# Patient Record
Sex: Male | Born: 2000 | Race: Black or African American | Hispanic: No | Marital: Single | Smoking: Current every day smoker
Health system: Southern US, Community
[De-identification: ages and names within clinical notes are randomized; demographics above are authoritative.]

## PROBLEM LIST (undated history)

## (undated) DIAGNOSIS — R569 Unspecified convulsions: Secondary | ICD-10-CM

## (undated) DIAGNOSIS — J45909 Unspecified asthma, uncomplicated: Secondary | ICD-10-CM

## (undated) DIAGNOSIS — S069XAA Unspecified intracranial injury with loss of consciousness status unknown, initial encounter: Secondary | ICD-10-CM

## (undated) DIAGNOSIS — S069X9A Unspecified intracranial injury with loss of consciousness of unspecified duration, initial encounter: Secondary | ICD-10-CM

---
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
## 2021-01-29 ENCOUNTER — Encounter: Payer: Self-pay | Admitting: Emergency Medicine

## 2021-01-29 ENCOUNTER — Other Ambulatory Visit: Payer: Self-pay

## 2021-01-29 ENCOUNTER — Emergency Department
Admission: EM | Admit: 2021-01-29 | Discharge: 2021-01-29 | Disposition: A | Discharge status: Home / Self Care | Payer: No Typology Code available for payment source | Attending: Emergency Medicine | Admitting: Emergency Medicine

## 2021-01-29 DIAGNOSIS — F1210 Cannabis abuse, uncomplicated: Secondary | ICD-10-CM | POA: Diagnosis present

## 2021-01-29 DIAGNOSIS — Z79899 Other long term (current) drug therapy: Secondary | ICD-10-CM | POA: Insufficient documentation

## 2021-01-29 DIAGNOSIS — Z5321 Procedure and treatment not carried out due to patient leaving prior to being seen by health care provider: Secondary | ICD-10-CM | POA: Insufficient documentation

## 2021-01-29 LAB — URINE DRUG SCREEN, QUALITATIVE (ARMC ONLY)
Amphetamines, Ur Screen: NOT DETECTED
Barbiturates, Ur Screen: NOT DETECTED
Benzodiazepine, Ur Scrn: NOT DETECTED
Cannabinoid 50 Ng, Ur ~~LOC~~: POSITIVE — AB
Cocaine Metabolite,Ur ~~LOC~~: NOT DETECTED
MDMA (Ecstasy)Ur Screen: NOT DETECTED
Methadone Scn, Ur: NOT DETECTED
Opiate, Ur Screen: NOT DETECTED
Phencyclidine (PCP) Ur S: NOT DETECTED
Tricyclic, Ur Screen: NOT DETECTED

## 2021-01-29 LAB — SALICYLATE LEVEL: Salicylate Lvl: <7 mg/dL — ABNORMAL LOW (ref 7.0–30.0)

## 2021-01-29 LAB — COMPREHENSIVE METABOLIC PANEL
ALT: 30 U/L (ref 0–44)
AST: 34 U/L (ref 15–41)
Albumin: 5 g/dL (ref 3.5–5.0)
Alkaline Phosphatase: 67 U/L (ref 38–126)
Anion gap: 7 (ref 5–15)
BUN: 11 mg/dL (ref 6–20)
CO2: 24 mmol/L (ref 22–32)
Calcium: 9.7 mg/dL (ref 8.9–10.3)
Chloride: 105 mmol/L (ref 98–111)
Creatinine, Ser: 0.9 mg/dL (ref 0.61–1.24)
GFR, Estimated: >60 mL/min (ref >60)
Glucose, Bld: 90 mg/dL (ref 70–99)
Potassium: 3.8 mmol/L (ref 3.5–5.1)
Sodium: 136 mmol/L (ref 135–145)
Total Bilirubin: 0.6 mg/dL (ref 0.3–1.2)
Total Protein: 8.6 g/dL — ABNORMAL HIGH (ref 6.5–8.1)

## 2021-01-29 LAB — CBC
HCT: 46.8 % (ref 39.0–52.0)
Hemoglobin: 15.5 g/dL (ref 13.0–17.0)
MCH: 26 pg (ref 26.0–34.0)
MCHC: 33.1 g/dL (ref 30.0–36.0)
MCV: 78.5 fL — ABNORMAL LOW (ref 80.0–100.0)
Platelets: 361 10*3/uL (ref 150–400)
RBC: 5.96 MIL/uL — ABNORMAL HIGH (ref 4.22–5.81)
RDW: 14.1 % (ref 11.5–15.5)
WBC: 9 10*3/uL (ref 4.0–10.5)
nRBC: 0 % (ref 0.0–0.2)

## 2021-01-29 LAB — ACETAMINOPHEN LEVEL: Acetaminophen (Tylenol), Serum: <10 ug/mL — ABNORMAL LOW (ref 10–30)

## 2021-01-29 LAB — ETHANOL: Alcohol, Ethyl (B): <10 mg/dL (ref <10)

## 2021-01-29 NOTE — ED Triage Notes (Signed)
Pt comes into the ED via [facility] c/o need for detox ftrom marijuana.  Pt has been smoking for 5 years and wants to seek help.  Pt walked up to the EMS station.  Pt in NAD.   VSS 104HR 99% RA 152/78

## 2021-01-29 NOTE — ED Notes (Signed)
Pt states he no longer wants to be seen by a provider.  He states "I don't think I really need help".  Pt left hospital facility.

## 2021-02-02 ENCOUNTER — Emergency Department
Admission: EM | Admit: 2021-02-02 | Discharge: 2021-02-03 | Disposition: A | Discharge status: Home / Self Care | Payer: Medicaid Other | Attending: Emergency Medicine | Admitting: Emergency Medicine

## 2021-02-02 ENCOUNTER — Encounter: Payer: Self-pay | Admitting: Emergency Medicine

## 2021-02-02 ENCOUNTER — Other Ambulatory Visit: Payer: Self-pay

## 2021-02-02 DIAGNOSIS — Z20822 Contact with and (suspected) exposure to covid-19: Secondary | ICD-10-CM | POA: Diagnosis not present

## 2021-02-02 DIAGNOSIS — F909 Attention-deficit hyperactivity disorder, unspecified type: Secondary | ICD-10-CM | POA: Diagnosis not present

## 2021-02-02 DIAGNOSIS — Z79899 Other long term (current) drug therapy: Secondary | ICD-10-CM | POA: Insufficient documentation

## 2021-02-02 DIAGNOSIS — R4689 Other symptoms and signs involving appearance and behavior: Secondary | ICD-10-CM

## 2021-02-02 DIAGNOSIS — J45909 Unspecified asthma, uncomplicated: Secondary | ICD-10-CM | POA: Diagnosis not present

## 2021-02-02 DIAGNOSIS — S0990XA Unspecified injury of head, initial encounter: Secondary | ICD-10-CM | POA: Diagnosis present

## 2021-02-02 DIAGNOSIS — X58XXXA Exposure to other specified factors, initial encounter: Secondary | ICD-10-CM | POA: Diagnosis not present

## 2021-02-02 DIAGNOSIS — R4189 Other symptoms and signs involving cognitive functions and awareness: Secondary | ICD-10-CM | POA: Diagnosis not present

## 2021-02-02 DIAGNOSIS — F419 Anxiety disorder, unspecified: Secondary | ICD-10-CM | POA: Insufficient documentation

## 2021-02-02 DIAGNOSIS — Z720 Tobacco use: Secondary | ICD-10-CM | POA: Diagnosis present

## 2021-02-02 DIAGNOSIS — F0781 Postconcussional syndrome: Secondary | ICD-10-CM | POA: Insufficient documentation

## 2021-02-02 DIAGNOSIS — Z046 Encounter for general psychiatric examination, requested by authority: Secondary | ICD-10-CM | POA: Diagnosis not present

## 2021-02-02 DIAGNOSIS — F209 Schizophrenia, unspecified: Secondary | ICD-10-CM | POA: Diagnosis not present

## 2021-02-02 DIAGNOSIS — H547 Unspecified visual loss: Secondary | ICD-10-CM | POA: Diagnosis not present

## 2021-02-02 DIAGNOSIS — S069X9A Unspecified intracranial injury with loss of consciousness of unspecified duration, initial encounter: Secondary | ICD-10-CM | POA: Diagnosis present

## 2021-02-02 DIAGNOSIS — H543 Unqualified visual loss, both eyes: Secondary | ICD-10-CM | POA: Diagnosis present

## 2021-02-02 DIAGNOSIS — R419 Unspecified symptoms and signs involving cognitive functions and awareness: Secondary | ICD-10-CM | POA: Insufficient documentation

## 2021-02-02 DIAGNOSIS — F2089 Other schizophrenia: Secondary | ICD-10-CM | POA: Insufficient documentation

## 2021-02-02 DIAGNOSIS — F203 Undifferentiated schizophrenia: Secondary | ICD-10-CM | POA: Diagnosis present

## 2021-02-02 MED ORDER — BUSPIRONE HCL 5 MG PO TABS: 20.0000 mg | ORAL_TABLET | Freq: Every morning | ORAL | Status: DC

## 2021-02-02 MED ORDER — ARIPIPRAZOLE 10 MG PO TABS
20.0000 mg | ORAL_TABLET | Freq: Every day | ORAL | Status: DC
  Filled 2021-02-02: qty 2

## 2021-02-02 MED ORDER — HYDROXYZINE HCL 25 MG PO TABS: 25.0000 mg | ORAL_TABLET | Freq: Every day | ORAL | Status: DC

## 2021-02-02 MED ORDER — BUSPIRONE HCL 5 MG PO TABS: 10.0000 mg | ORAL_TABLET | Freq: Every day | ORAL | Status: DC
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
## 2021-02-02 NOTE — ED Notes (Signed)
Pt is asleep at time will give snack later

## 2021-02-02 NOTE — Consult Note (Signed)
[facility] Face-to-Face Psychiatry Consult   Reason for Consult: Blood in my head Referring Physician: Dr. Derrill Kay Patient Identification: Dennis Dodson MRN:  [id number] Principal Diagnosis: <principal problem not specified> Diagnosis:  Active Problems:   Attention-deficit hyperactivity disorder   Closed head injury   Cognitive and behavioral changes   Post concussion syndrome   Traumatic brain injury with loss of consciousness (HCC)   Visual loss, both eyes unqualified   Anxiety disorder   Other long term (current) drug therapy   Other schizophrenia (HCC)   Nicotine abuse   Total Time spent with patient: 1 hour  Subjective: "I have blood in my head." Dennis Dodson is a 20 y.o. male patient [facility] ED voluntarily. The patient was brought to the ED by law enforcement in handcuffs. It was reported that the patient was cooperative during his assessment. Once the patient was brought into the ED, the police officer left. The patient share that he is here because of internal bleeding in his head. He placed his hands on his head and squeezed the loose skin stating that his head was bleeding. When was he asked why the police brought him in handcuffs? The patient said, "I don't know." The patient stated his head was hurting him a lot but not anymore.   The patient said, "I'm bleeding at the top of my head." He was asked if he saw any blood. The patient denies seeing any blood. The patient appears to have shaven his head. "I cut my hair because it was irritating." The patient currently sees an outpatient psychiatrist with [facility], Dr. Dorene Grebe, and lives at home. The patient is now on an injectible Abilify Maintena 400 mg every 28 days, and the patient missed the month of November. The patient was seen face-to-face by this provider; the chart was reviewed and consulted with Dr. Derrill Kay on 02/02/2021 due to the patient's care. It was discussed with the EDP that the patient  does not meet the criteria to be admitted to the psychiatric inpatient unit.  On evaluation, the patient is alert and oriented x 3, calm, cooperative, and mood-congruent with affect. The patient does not appear to be responding to internal or external stimuli. The patient is presenting with some delusional thinking. The patient denies auditory or visual hallucinations. The patient denies any suicidal, homicidal, or self-harm ideations. The patient is not presenting with any psychotic, but he is presenting some paranoid behaviors.   By Ms. Handy: Collateral information was obtained from patient's mother Dennis Dodson [phone] who reports "he hasn't had his shot for the last month and he's due for it." "He has an appointment with his psychiatrist tomorrow morning at 8am." "Today he just flipped out but he's not a danger to himself or anyone else." Mother reports that she would prefer that patient be observed overnight and will take patient to his psychiatrist in the morning for his injection.  HPI: Dr. Derrill Kay, Dennis Dodson is a 20 y.o. male who presents to the emergency department today brought by police. The patient has history of psychiatric illness. Primary complaint is for bleeding in his head. When asked why he thinks he is bleeding, he squeezes the skin of his head to demonstrate the bleeding. The patient denies any other complaints.    Past Psychiatric History:  Schizophrenia.   Risk to Self:   Risk to Others:   Prior Inpatient Therapy:   Prior Outpatient Therapy:    Past Medical History:  Past Medical History:  Diagnosis Date  Altered mental status 04/02/2019   Asthma    Hypersomnolence 08/29/2018   Mood disorder (HCC) 07/24/2019   Mood disturbance 08/29/2018   Other schizophrenia (HCC) 03/02/2020   Seizures (HCC)    TBI (traumatic brain injury)    No past surgical history on file. Family History: No family history on file. Family Psychiatric  History:  Social History:  Social  History   Substance and Sexual Activity  Alcohol Use No     Social History   Substance and Sexual Activity  Drug Use Not Currently   Types: Marijuana    Social History   Socioeconomic History   Marital status: Single    Spouse name: Not on file   Number of children: 0   Years of education: Not on file   Highest education level: 10th grade  Occupational History   Not on file  Tobacco Use   Smoking status: Never   Smokeless tobacco: Never  Vaping Use   Vaping Use: Never used  Substance and Sexual Activity   Alcohol use: No   Drug use: Not Currently    Types: Marijuana   Sexual activity: Never  Other Topics Concern   Not on file  Social History Narrative   Not on file   Social Determinants of Health   Financial Resource Strain: Not on file  Food Insecurity: Not on file  Transportation Needs: Not on file  Physical Activity: Not on file  Stress: Not on file  Social Connections: Not on file   Additional Social History:    Allergies:  No Known Allergies  Labs: No results found for this or any previous visit (from the past 48 hour(s)).  Current Facility-Administered Medications  Medication Dose Route Frequency Provider Last Rate Last Admin   ARIPiprazole (ABILIFY) tablet 20 mg  20 mg Oral QHS Gillermo Murdoch, NP       busPIRone (BUSPAR) tablet 10 mg  10 mg Oral QHS Gillermo Murdoch, NP       Melene Muller ON 02/03/2021] busPIRone (BUSPAR) tablet 20 mg  20 mg Oral q morning Gillermo Murdoch, NP       hydrOXYzine (ATARAX) tablet 25 mg  25 mg Oral QHS Gillermo Murdoch, NP       Current Outpatient Medications  Medication Sig Dispense Refill   albuterol (PROVENTIL HFA;VENTOLIN HFA) 108 (90 Base) MCG/ACT inhaler Inhale 2 puffs into the lungs every 6 (six) hours as needed for wheezing or shortness of breath. 1 Inhaler 2   ARIPiprazole (ABILIFY) 20 MG tablet TAKE 1 TABLET(20 MG) BY MOUTH DAILY 30 tablet 0   ARIPiprazole ER (ABILIFY MAINTENA) 400 MG SRER  injection Inject 2 mLs (400 mg total) into the muscle every 28 (twenty-eight) days. 1 each 1   busPIRone (BUSPAR) 10 MG tablet TAKE 1 TABLET(10 MG) BY MOUTH TWICE DAILY 60 tablet 1    Musculoskeletal: Strength & Muscle Tone: within normal limits Gait & Station: normal Patient leans: N/A  Psychiatric Specialty Exam:  Presentation  General Appearance: Bizarre  Eye Contact:Fair  Speech:Slow  Speech Volume:Decreased  Handedness:Right   Mood and Affect  Mood:Euthymic  Affect:Inappropriate; Congruent   Thought Process  Thought Processes:Coherent  Descriptions of Associations:Loose  Orientation:Full (Time, Place and Person)  Thought Content:Illogical; Obsessions; Scattered  History of Schizophrenia/Schizoaffective disorder:Yes  Duration of Psychotic Symptoms:Greater than six months  Hallucinations:Hallucinations: None  Ideas of Reference:None  Suicidal Thoughts:Suicidal Thoughts: No  Homicidal Thoughts:Homicidal Thoughts: No   Sensorium  Memory:Immediate Poor; Recent Poor; Remote Poor  Judgment:Fair  Insight:Poor  Executive Functions  Concentration:Fair  Attention Span:Fair  Recall:Poor  [employer] of Knowledge:Poor  Language:Poor   Psychomotor Activity  Psychomotor Activity:Psychomotor Activity: Normal   Assets  Assets:Communication Skills; Desire for Improvement; Physical Health; Resilience; Social Support   Sleep  Sleep:Sleep: Good Number of Hours of Sleep: 8   Physical Exam: Physical Exam Vitals and nursing note reviewed.  Constitutional:      Appearance: Normal appearance.  HENT:     Head: Normocephalic and atraumatic.     Right Ear: External ear normal.     Left Ear: External ear normal.     Nose: Nose normal.     Mouth/Throat:     Mouth: Mucous membranes are moist.  Cardiovascular:     Rate and Rhythm: Normal rate.     Pulses: Normal pulses.  Pulmonary:     Effort: Pulmonary effort is normal.  Musculoskeletal:         General: Normal range of motion.     Cervical back: Normal range of motion and neck supple.  Neurological:     Mental Status: He is alert and oriented to person, place, and time.  Psychiatric:        Attention and Perception: Attention and perception normal.        Mood and Affect: Affect is blunt and flat.        Speech: Speech is delayed.        Behavior: Behavior is slowed. Behavior is cooperative.        Thought Content: Thought content is delusional.        Cognition and Memory: Cognition is impaired.        Judgment: Judgment is impulsive and inappropriate.   Review of Systems  Psychiatric/Behavioral:         No UDS on file  All other systems reviewed and are negative. Blood pressure 128/71, pulse 69, temperature 98.2 F (36.8 C), temperature source Oral, resp. rate 17, height 5\' 9"  (1.753 m), weight 79.4 kg, SpO2 97 %. Body mass index is 25.84 kg/m.  Treatment Plan Summary: Medication management and Plan Patient will be discharge in the morning for his 0800 psych appointment.  Disposition: No evidence of imminent risk to self or others at present.   Patient does not meet criteria for psychiatric inpatient admission. Supportive therapy provided about ongoing stressors. Discussed crisis plan, support from social network, calling 911, coming to the Emergency Department, and calling Suicide Hotline.  , NP 02/02/2021 11:41 PM

## 2021-02-02 NOTE — ED Provider Notes (Signed)
[facility] Emergency Department Provider Note    ____________________________________________   I have reviewed the triage vital signs and the nursing notes.   HISTORY  Chief Complaint Blood in my head   History limited by:  Poor historian   HPI Dennis Dodson is a 20 y.o. male who presents to the emergency department today brought by police. The patient has history of psychiatric illness. Primary complaint is for bleeding in his head. When asked why he thinks he is bleeding, he squeezes the skin of his head to demonstrate the bleeding. The patient denies any other complaints.   Records reviewed. Per medical record review patient has a history of schizophrenia.   Past Medical History:  Diagnosis Date   Altered mental status 04/02/2019   Asthma    Hypersomnolence 08/29/2018   Mood disorder (HCC) 07/24/2019   Mood disturbance 08/29/2018   Other schizophrenia (HCC) 03/02/2020   Seizures (HCC)    TBI (traumatic brain injury)     Patient Active Problem List   Diagnosis Date Noted   Nicotine abuse 12/03/2020   Other schizophrenia (HCC) 03/02/2020   Other long term (current) drug therapy 07/24/2019   Anxiety disorder 06/19/2019   BMI (body mass index), pediatric, 85% to less than 95% for age 10/09/2019   Closed head injury 08/29/2018   Cognitive and behavioral changes 08/29/2018   Post concussion syndrome 08/29/2018   Traumatic brain injury with loss of consciousness (HCC) 08/29/2018   Visual loss, both eyes unqualified 01/31/2014   Attention-deficit hyperactivity disorder 12/11/2013   Other seasonal allergic rhinitis 09/27/2013    No past surgical history on file.  Prior to Admission medications   Medication Sig Start Date End Date Taking? Authorizing Provider  albuterol (PROVENTIL HFA;VENTOLIN HFA) 108 (90 Base) MCG/ACT inhaler Inhale 2 puffs into the lungs every 6 (six) hours as needed for wheezing or shortness of breath. 07/13/15   Triplett, Cari B,  FNP  ARIPiprazole (ABILIFY) 20 MG tablet TAKE 1 TABLET(20 MG) BY MOUTH DAILY 12/03/20   Darcel Smalling, MD  ARIPiprazole ER (ABILIFY MAINTENA) 400 MG SRER injection Inject 2 mLs (400 mg total) into the muscle every 28 (twenty-eight) days. 12/03/20   Clapacs, Jackquline Denmark, MD  busPIRone (BUSPAR) 10 MG tablet TAKE 1 TABLET(10 MG) BY MOUTH TWICE DAILY 12/03/20   Darcel Smalling, MD    Allergies Patient has no known allergies.  No family history on file.  Social History Social History   Tobacco Use   Smoking status: Never   Smokeless tobacco: Never  Vaping Use   Vaping Use: Never used  Substance Use Topics   Alcohol use: No   Drug use: Not Currently    Types: Marijuana    Review of Systems Constitutional: No fever/chills Eyes: No visual changes. ENT: No sore throat. Cardiovascular: Denies chest pain. Respiratory: Denies shortness of breath. Gastrointestinal: No abdominal pain.  No nausea, no vomiting.  No diarrhea.   Genitourinary: Negative for dysuria. Musculoskeletal: Negative for back pain. Skin: Negative for rash. Neurological: Negative for headaches, focal weakness or numbness.  ____________________________________________   PHYSICAL EXAM:  VITAL SIGNS: ED Triage Vitals  Enc Vitals Group     BP 02/02/21 1753 (!) 133/92     Pulse Rate 02/02/21 1753 92     Resp 02/02/21 1753 16     Temp 02/02/21 1753 98.2 F (36.8 C)     Temp Source 02/02/21 1753 Oral     SpO2 02/02/21 1753 96 %  Weight 02/02/21 1754 175 lb (79.4 kg)     Height 02/02/21 1754 5\' 9"  (1.753 m)     Head Circumference --      Peak Flow --      Pain Score 02/02/21 1753 0   Constitutional: Awake and alert. Eyes: Conjunctivae are normal.  ENT      Head: Normocephalic and atraumatic.      Nose: No congestion/rhinnorhea.      Mouth/Throat: Mucous membranes are moist.      Neck: No stridor. Hematological/Lymphatic/Immunilogical: No cervical lymphadenopathy. Cardiovascular: Normal rate, regular  rhythm.  No murmurs, rubs, or gallops.  Respiratory: Normal respiratory effort without tachypnea nor retractions. Breath sounds are clear and equal bilaterally. No wheezes/rales/rhonchi. Gastrointestinal: Soft and non tender. No rebound. No guarding.  Genitourinary: Deferred Musculoskeletal: Normal range of motion in all extremities. No lower extremity edema. Neurologic:  Normal speech and language. No gross focal neurologic deficits are appreciated.  Skin:  Skin is warm, dry and intact. No rash noted. Psychiatric: Abnormal behavior and thought.  ____________________________________________    LABS (pertinent positives/negatives)  None  ____________________________________________   EKG  None  ____________________________________________    RADIOLOGY  None  ____________________________________________   PROCEDURES  Procedures  ____________________________________________   INITIAL IMPRESSION / ASSESSMENT AND PLAN / ED COURSE  Pertinent labs & imaging results that were available during my care of the patient were reviewed by me and considered in my medical decision making (see chart for details).   Patient presents to the emergency department with primary concern for blood in his head.  Patient does exhibit some odd behavior here in the emergency department.  At this time I think likely patient suffering from psychiatric illness.  I have low suspicion for actual intracranial hemorrhage considering lack of headache.  Initially patient is more concerned about blood under his scalp as he demonstrates.  Will have psychiatry evaluate.  The patient has been placed in psychiatric observation due to the need to provide a safe environment for the patient while obtaining psychiatric consultation and evaluation, as well as ongoing medical and medication management to treat the patient's condition.  The patient has not been placed under full IVC at this  time.   ____________________________________________   FINAL CLINICAL IMPRESSION(S) / ED DIAGNOSES  Final diagnoses:  Abnormal behavior     Note: This dictation was prepared with Dragon dictation. Any transcriptional errors that result from this process are unintentional     02/04/21, MD 02/02/21 1950

## 2021-02-02 NOTE — BH Assessment (Signed)
Comprehensive Clinical Assessment (CCA) Note  02/02/2021 Chayson Grall [id number]  Chief Complaint: Patient is a 20 year old male presenting to [facility] ED voluntarily. Per triage note Pt brought in by police, but is cooperative, police is no longer with pt. He reports that he is here because of internal bleeding. He placed his hands on his head and squeezed the loose skin stating that his head is bleeding. Denies having any thoughts of harming himself or others. When ask why the police brought him in handcuffs he reports that he has no idea why they brought him in. Denies any pain at this time. Once Provider came in room to do MSE, pt was slow to respond to questions, states that he smokes Black and Milds, cigarettes and marijuana. During assessment patient appears alert and oriented x3, calm and cooperative. Patient reports that "I'm bleeding at the top of my head." Patient appears to have shaven his head "I cut my hair because it was irritating." Patient currently sees a outpatient psychiatrist with [place] [facility] and is currently living with his parents. Patient denies SI/HI/AH/VH and does not appear to be responding to any internal or external stimuli.  Collateral information was obtained from patient's mother Jeannett Senior [phone] who reports "he hasn't had his shot for the last month and he's due for it." "He has an appointment with his psychiatrist tomorrow morning at 8am." "Today he just flipped out but he's not a danger to himself or anyone else." Mother reports that she would prefer that patient be observed overnight and will take patient to his psychiatrist in the morning for his injection.  Per Psyc NP Ysidro Evert, patient to be observed overnight and discharged back to mother for follow-up with outpatient provider Chief Complaint  Patient presents with   Psychiatric Evaluation   Visit Diagnosis: Other Schizophrenia by hx    CCA Screening, Triage and  Referral (STR)  Patient Reported Information How did you hear about [place]? Legal System  Referral name: Jeannett Senior  Referral phone number: [phone]   Whom do you see for routine medical problems? Primary Care  Practice/Facility Name: [facility] [id number]  Practice/Facility Phone Number: No data recorded Name of Contact: No data recorded Contact Number: [phone]  Contact Fax Number: No data recorded Prescriber Name: No data recorded Prescriber Address (if known): No data recorded  What Is the Reason for Your Visit/Call Today? Patient presents voluntarily in police custody  How Long Has This Been Causing You Problems? 1 wk - 1 month  What Do You Feel Would Help You the Most Today? Other (Comment) (Pt unable to articulate needs during the assessment.)   Have You Recently Been in Any Inpatient Treatment (Hospital/Detox/Crisis Center/28-Day Program)? No  Name/Location of Program/Hospital:No data recorded How Long Were You There? No data recorded When Were You Discharged? No data recorded  Have You Ever Received Services From [facility] Before? No  Who Do You See at [facility]? No data recorded  Have You Recently Had Any Thoughts About Hurting Yourself? No  Are You Planning to Commit Suicide/Harm Yourself At This time? No   Have you Recently Had Thoughts About [place]? No  Explanation: No data recorded  Have You Used Any Alcohol or Drugs in the Past 24 Hours? No  How Long Ago Did You Use Drugs or Alcohol? No data recorded What Did You Use and How Much? No data recorded  Do You Currently Have a Therapist/Psychiatrist? Yes  Name  of Therapist/Psychiatrist: [facility] Recently Discharged From Any Office Practice or Programs? No  Explanation of Discharge From Practice/Program: No data recorded    CCA Screening Triage Referral Assessment Type of Contact: Face-to-Face  Is this  Initial or Reassessment? No data recorded Date Telepsych consult ordered in [facility]:  No data recorded Time Telepsych consult ordered in [facility]:  No data recorded  Patient Reported Information Reviewed? Yes  Patient Left Without Being Seen? No data recorded Reason for Not Completing Assessment: No data recorded  Collateral Involvement: Mother Jeannett Senior [phone]   Does Patient Have a Court Appointed Legal Guardian? No data recorded Name and Contact of Legal Guardian: No data recorded If Minor and Not Living with Parent(s), Who has Custody? n/a  Is CPS involved or ever been involved? Never  Is APS involved or ever been involved? Never   Patient Determined To Be At Risk for Harm To Self or Others Based on Review of Patient Reported Information or Presenting Complaint? No  Method: No data recorded Availability of Means: No data recorded Intent: No data recorded Notification Required: No data recorded Additional Information for Danger to Others Potential: No data recorded Additional Comments for Danger to Others Potential: No data recorded Are There Guns or Other Weapons in Your Home? No data recorded Types of Guns/Weapons: No data recorded Are These Weapons Safely Secured?                            No data recorded Who Could Verify You Are Able To Have These Secured: No data recorded Do You Have any Outstanding Charges, Pending Court Dates, Parole/Probation? No data recorded Contacted To Inform of Risk of Harm To Self or Others: No data recorded  Location of Assessment: [facility] ED   Does Patient Present under Involuntary Commitment? No  IVC Papers Initial File Date: No data recorded  [place] of Residence: [place]   Patient Currently Receiving the Following Services: Medication Management   Determination of Need: Emergent (2 hours)   Options For Referral: Inpatient Hospitalization     CCA Biopsychosocial Intake/Chief Complaint:  "I won't shut up" in the context of  familial interactions  Current Symptoms/Problems: Pt has been exhibiting bizarre behavior, experiencing severe mood swings, and responds to internal stimuli.   Patient Reported Schizophrenia/Schizoaffective Diagnosis in Past: Yes   Strengths: Patient has some difficulty communicating his needs but is able to communicate to some extent  Preferences: None noted  Abilities: n/a   Type of Services Patient Feels are Needed: Pt unable to articulate needs   Initial Clinical Notes/Concerns: Pt seemingly responding to internal stimuli during interview however pt denies it.   Mental Health Symptoms Depression:   None   Duration of Depressive symptoms: No data recorded  Mania:   None   Anxiety:    Irritability; Worrying   Psychosis:   Delusions   Duration of Psychotic symptoms:  Greater than six months   Trauma:   None   Obsessions:   Cause anxiety; Recurrent & persistent thoughts/impulses/images; Poor insight   Compulsions:   Absent insight/delusional   Inattention:   None   Hyperactivity/Impulsivity:   None   Oppositional/Defiant Behaviors:   None   Emotional Irregularity:   None   Other Mood/Personality Symptoms:   Mother reports pt has extreme emotional lability and remarkable mood swings    Mental Status Exam Appearance and self-care  Stature:   Average  Weight:   Average weight   Clothing:   Casual   Grooming:   Normal   Cosmetic use:   None   Posture/gait:   Normal   Motor activity:   Not Remarkable   Sensorium  Attention:   Normal   Concentration:   Normal   Orientation:   X5   Recall/memory:   Normal   Affect and Mood  Affect:   Flat   Mood:   Anxious   Relating  Eye contact:   Normal   Facial expression:   Responsive   Attitude toward examiner:   Cooperative   Thought and Language  Speech flow:  Clear and Coherent   Thought content:   Delusions   Preoccupation:   Ruminations   Hallucinations:    None   Organization:  No data recorded  [employer] of Knowledge:   Fair   Intelligence:   Average   Abstraction:   Functional   Judgement:   Fair   Reality Testing:   Adequate   Insight:   Lacking   Decision Making:   Confused   Social Functioning  Social Maturity:   Responsible   Social Judgement:   Normal   Stress  Stressors:   Other (Comment)   Coping Ability:   Normal   Skill Deficits:   None   Supports:   Family; Friends/Service system     Religion: Religion/Spirituality Are You A Religious Person?: No  Leisure/Recreation: Leisure / Recreation Do You Have Hobbies?: No  Exercise/Diet: Exercise/Diet Do You Exercise?: No Have You Gained or Lost A Significant Amount of Weight in the Past Six Months?: No Do You Follow a Special Diet?: No Do You Have Any Trouble Sleeping?: No   CCA Employment/Education Employment/Work Situation: Employment / Work Technical sales engineer: On disability Why is Patient on Disability: Mental Health How Long has Patient Been on Disability: Unknown Patient's Job has Been Impacted by Current Illness: No Has Patient ever Been in the [employer]?: No  Education: Education Is Patient Currently Attending School?: No Did You Have An Individualized Education Program (IIEP): No Did You Have Any Difficulty At School?: No Patient's Education Has Been Impacted by Current Illness: No   CCA Family/Childhood History Family and Relationship History: Family history Marital status: Single Does patient have children?: No  Childhood History:  Childhood History By whom was/is the patient raised?: Both parents Did patient suffer any verbal/emotional/physical/sexual abuse as a child?: No Did patient suffer from severe childhood neglect?: No Has patient ever been sexually abused/assaulted/raped as an adolescent or adult?: No Was the patient ever a victim of a crime or a disaster?: No Witnessed  domestic violence?: No Has patient been affected by domestic violence as an adult?: No  Child/Adolescent Assessment:     CCA Substance Use Alcohol/Drug Use: Alcohol / Drug Use Pain Medications: See MAR Prescriptions: See MAR Over the Counter: See MAR History of alcohol / drug use?: Yes Substance #1 Name of Substance 1: Marijuana                       ASAM's:  Six Dimensions of Multidimensional Assessment  Dimension 1:  Acute Intoxication and/or Withdrawal Potential:      Dimension 2:  Biomedical Conditions and Complications:      Dimension 3:  Emotional, Behavioral, or Cognitive Conditions and Complications:     Dimension 4:  Readiness to Change:     Dimension 5:  Relapse, Continued use, or Continued  Problem Potential:     Dimension 6:  Recovery/Living Environment:     ASAM Severity Score:    ASAM Recommended Level of Treatment:     Substance use Disorder (SUD)    Recommendations for Services/Supports/Treatments:    DSM5 Diagnoses: Patient Active Problem List   Diagnosis Date Noted   Nicotine abuse 12/03/2020   Other schizophrenia (HCC) 03/02/2020   Other long term (current) drug therapy 07/24/2019   Anxiety disorder 06/19/2019   BMI (body mass index), pediatric, 85% to less than 95% for age 06/09/2019   Closed head injury 08/29/2018   Cognitive and behavioral changes 08/29/2018   Post concussion syndrome 08/29/2018   Traumatic brain injury with loss of consciousness (HCC) 08/29/2018   Visual loss, both eyes unqualified 01/31/2014   Attention-deficit hyperactivity disorder 12/11/2013   Other seasonal allergic rhinitis 09/27/2013    Patient Centered Plan: Patient is on the following Treatment Plan(s):  Impulse Control   Referrals to Alternative Service(s): Referred to Alternative Service(s):   Place:   Date:   Time:    Referred to Alternative Service(s):   Place:   Date:   Time:    Referred to Alternative Service(s):   Place:   Date:   Time:     Referred to Alternative Service(s):   Place:   Date:   Time:     Evangelyne Loja A Danaka Llera, LCAS-A

## 2021-02-02 NOTE — ED Notes (Signed)
Mother called would like to speak to someone to inform  what is going on, said he missed a shot last month and needed it     Rodney Booze [phone]

## 2021-02-02 NOTE — ED Notes (Signed)
Pt is awake and refuse snack and drink at this time

## 2021-02-02 NOTE — ED Notes (Signed)
When asked any questions, patient responds, "I don't know".

## 2021-02-03 ENCOUNTER — Telehealth: Payer: Self-pay | Admitting: Child and Adolescent Psychiatry

## 2021-02-03 ENCOUNTER — Ambulatory Visit: Payer: No Typology Code available for payment source | Admitting: Child and Adolescent Psychiatry

## 2021-02-03 ENCOUNTER — Other Ambulatory Visit: Payer: Self-pay

## 2021-02-03 ENCOUNTER — Emergency Department (EMERGENCY_DEPARTMENT_HOSPITAL)
Admission: EM | Admit: 2021-02-03 | Discharge: 2021-02-04 | Disposition: A | Discharge status: Home / Self Care | Payer: Medicaid Other | Source: Home / Self Care | Attending: Emergency Medicine | Admitting: Emergency Medicine

## 2021-02-03 DIAGNOSIS — R451 Restlessness and agitation: Secondary | ICD-10-CM | POA: Insufficient documentation

## 2021-02-03 DIAGNOSIS — F203 Undifferentiated schizophrenia: Secondary | ICD-10-CM

## 2021-02-03 DIAGNOSIS — Y900 Blood alcohol level of less than 20 mg/100 ml: Secondary | ICD-10-CM | POA: Insufficient documentation

## 2021-02-03 DIAGNOSIS — J45909 Unspecified asthma, uncomplicated: Secondary | ICD-10-CM | POA: Insufficient documentation

## 2021-02-03 DIAGNOSIS — F2089 Other schizophrenia: Secondary | ICD-10-CM | POA: Insufficient documentation

## 2021-02-03 DIAGNOSIS — Z20822 Contact with and (suspected) exposure to covid-19: Secondary | ICD-10-CM | POA: Insufficient documentation

## 2021-02-03 DIAGNOSIS — Z79899 Other long term (current) drug therapy: Secondary | ICD-10-CM | POA: Insufficient documentation

## 2021-02-03 DIAGNOSIS — F209 Schizophrenia, unspecified: Secondary | ICD-10-CM

## 2021-02-03 LAB — COMPREHENSIVE METABOLIC PANEL
ALT: 34 U/L (ref 0–44)
AST: 38 U/L (ref 15–41)
Albumin: 5.1 g/dL — ABNORMAL HIGH (ref 3.5–5.0)
Alkaline Phosphatase: 71 U/L (ref 38–126)
Anion gap: 7 (ref 5–15)
BUN: 12 mg/dL (ref 6–20)
CO2: 24 mmol/L (ref 22–32)
Calcium: 9.7 mg/dL (ref 8.9–10.3)
Chloride: 106 mmol/L (ref 98–111)
Creatinine, Ser: 0.91 mg/dL (ref 0.61–1.24)
GFR, Estimated: >60 mL/min (ref >60)
Glucose, Bld: 94 mg/dL (ref 70–99)
Potassium: 4.1 mmol/L (ref 3.5–5.1)
Sodium: 137 mmol/L (ref 135–145)
Total Bilirubin: 0.7 mg/dL (ref 0.3–1.2)
Total Protein: 8.6 g/dL — ABNORMAL HIGH (ref 6.5–8.1)

## 2021-02-03 LAB — ETHANOL: Alcohol, Ethyl (B): <10 mg/dL (ref <10)

## 2021-02-03 LAB — CBC
HCT: 48 % (ref 39.0–52.0)
Hemoglobin: 16.1 g/dL (ref 13.0–17.0)
MCH: 26.5 pg (ref 26.0–34.0)
MCHC: 33.5 g/dL (ref 30.0–36.0)
MCV: 78.9 fL — ABNORMAL LOW (ref 80.0–100.0)
Platelets: 389 10*3/uL (ref 150–400)
RBC: 6.08 MIL/uL — ABNORMAL HIGH (ref 4.22–5.81)
RDW: 14 % (ref 11.5–15.5)
WBC: 12.5 10*3/uL — ABNORMAL HIGH (ref 4.0–10.5)
nRBC: 0 % (ref 0.0–0.2)

## 2021-02-03 LAB — URINE DRUG SCREEN, QUALITATIVE (ARMC ONLY)
Amphetamines, Ur Screen: NOT DETECTED
Barbiturates, Ur Screen: NOT DETECTED
Benzodiazepine, Ur Scrn: NOT DETECTED
Cannabinoid 50 Ng, Ur ~~LOC~~: POSITIVE — AB
Cocaine Metabolite,Ur ~~LOC~~: NOT DETECTED
MDMA (Ecstasy)Ur Screen: NOT DETECTED
Methadone Scn, Ur: NOT DETECTED
Opiate, Ur Screen: NOT DETECTED
Phencyclidine (PCP) Ur S: NOT DETECTED
Tricyclic, Ur Screen: NOT DETECTED

## 2021-02-03 LAB — SALICYLATE LEVEL: Salicylate Lvl: <7 mg/dL — ABNORMAL LOW (ref 7.0–30.0)

## 2021-02-03 LAB — ACETAMINOPHEN LEVEL: Acetaminophen (Tylenol), Serum: <10 ug/mL — ABNORMAL LOW (ref 10–30)

## 2021-02-03 MED ORDER — BUSPIRONE HCL 5 MG PO TABS: 10.0000 mg | ORAL_TABLET | Freq: Two times a day (BID) | ORAL | Status: DC

## 2021-02-03 MED ORDER — ZIPRASIDONE MESYLATE 20 MG IM SOLR
20.0000 mg | Freq: Once | INTRAMUSCULAR | Status: AC
  Administered 2021-02-03: 12:00:00 20 mg via INTRAMUSCULAR

## 2021-02-03 MED ORDER — LORAZEPAM 2 MG/ML IJ SOLN
2.0000 mg | Freq: Once | INTRAMUSCULAR | Status: AC
  Administered 2021-02-03: 15:00:00 2 mg via INTRAMUSCULAR
  Filled 2021-02-03: qty 1

## 2021-02-03 MED ORDER — ARIPIPRAZOLE 10 MG PO TABS
20.0000 mg | ORAL_TABLET | Freq: Every day | ORAL | Status: DC
  Filled 2021-02-03: qty 2

## 2021-02-03 MED ORDER — HALOPERIDOL LACTATE 5 MG/ML IJ SOLN
5.0000 mg | Freq: Once | INTRAMUSCULAR | Status: AC
  Administered 2021-02-03: 15:00:00 5 mg via INTRAMUSCULAR
  Filled 2021-02-03: qty 1

## 2021-02-03 MED ORDER — LORAZEPAM 2 MG PO TABS
2.0000 mg | ORAL_TABLET | Freq: Once | ORAL | Status: DC
  Filled 2021-02-03: qty 1

## 2021-02-03 MED ORDER — HALOPERIDOL 5 MG PO TABS
10.0000 mg | ORAL_TABLET | ORAL | Status: DC
  Filled 2021-02-03: qty 2

## 2021-02-03 MED ORDER — DIPHENHYDRAMINE HCL 25 MG PO CAPS
50.0000 mg | ORAL_CAPSULE | ORAL | Status: DC
  Filled 2021-02-03: qty 2

## 2021-02-03 MED ORDER — ARIPIPRAZOLE ER 400 MG IM SRER
400.0000 mg | INTRAMUSCULAR | Status: DC
  Administered 2021-02-04: 400 mg via INTRAMUSCULAR
  Filled 2021-02-03 (×2): qty 2

## 2021-02-03 NOTE — ED Notes (Signed)
Pt sleeping. 

## 2021-02-03 NOTE — ED Notes (Signed)
Patient dressed out at this time with this RN and Luther Parody EDT  Grey pants Yellow shorts Red underwear Grey socks Blue ripped shirt
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
## 2021-02-03 NOTE — ED Notes (Signed)
Patient awake and calm. Pt's mom called via phone number provided.

## 2021-02-03 NOTE — ED Notes (Signed)
IVC pending consult  patient sedated 

## 2021-02-03 NOTE — ED Notes (Signed)
Pt sleeping covers pulled over head.  Pt has not taken any po meds this afternoon or evening.

## 2021-02-03 NOTE — ED Provider Notes (Signed)
Patient was voluntary and is requesting to leave.  Patient's mom is going to pick him up and take him to the clinic in the a.m. to get his shot.   Concha Se, MD 02/03/21 [phone]

## 2021-02-03 NOTE — ED Notes (Signed)
Family updated about patient and condition in family room.  Mom's phone number [phone].  Per family- Dad is pt's main care giver. Reports that patient has been without his IM medication for two months and was supposed to receive shot today.
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
## 2021-02-03 NOTE — ED Notes (Addendum)
Patient up and agitated again. Requesting to leave or talk to a doctor. Security notified for increased presence. MD Scotty Court at bedside. Pt provided with phone to contact mom.
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
## 2021-02-03 NOTE — ED Notes (Signed)
Patient offered oral medications to help decrease agitation. Pt refusing medications despite being made aware of benefits. Repeatedly stating he wants to go home, that he "knows his rights".
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
## 2021-02-03 NOTE — ED Notes (Signed)
Patient laying in bed calmly after conversation. Aware that he will not be leaving tonight.
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
## 2021-02-03 NOTE — ED Notes (Signed)
IVC pending consult   
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
## 2021-02-04 ENCOUNTER — Telehealth: Payer: Self-pay | Admitting: Child and Adolescent Psychiatry

## 2021-02-04 DIAGNOSIS — F209 Schizophrenia, unspecified: Secondary | ICD-10-CM

## 2021-02-04 LAB — RESP PANEL BY RT-PCR (FLU A&B, COVID) ARPGX2
Influenza A by PCR: NEGATIVE
Influenza B by PCR: NEGATIVE
SARS Coronavirus 2 by RT PCR: NEGATIVE

## 2021-02-04 NOTE — ED Notes (Signed)
Up to bathroom

## 2021-02-04 NOTE — Telephone Encounter (Signed)
Patients mother called in requesting Dr. Marquis Lunch to give her a call back. She wants Dr. Marquis Lunch to call the ED to have her son released.

## 2021-02-04 NOTE — ED Notes (Signed)
Pt asking for snack and soda.  Pt informed breakfast is next mealtime.  Water and warm blanket provided

## 2021-02-04 NOTE — Discharge Instructions (Addendum)
Dr Jerold Coombe on 12/21 at 9 am
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
## 2021-02-04 NOTE — ED Notes (Signed)
Called dietary to bring tray for pt

## 2021-02-04 NOTE — ED Notes (Signed)
Patient given lunch meal. 

## 2021-02-04 NOTE — ED Provider Notes (Signed)
The patient has been evaluated at bedside psychiatry.  Patient is clinically stable.  Not felt to be a danger to self or others.  No SI or Hi.  No indication for inpatient psychiatric admission at this time.  Appropriate for continued outpatient therapy.    Willy Eddy, MD 02/04/21 1125

## 2021-02-04 NOTE — Telephone Encounter (Signed)
Received message from front desk -  "Patients mother called in requesting Dr. Marquis Lunch to give her a call back. She wants Dr. Marquis Lunch to call the ED to have her son released. "  I called his mother and discussed that decision about pt's disposition will be made by ED psychiatry team. She claims that pt is doing a lot better as compare to yesterday. I discussed with her that they don't want to go through the same situation as they did over the last two days due to his psychiatric decompensation and therefore recommended to work with the ER team regarding his plan. She verbalized understanding.

## 2021-02-04 NOTE — BH Assessment (Signed)
Adult MH  Referral information for Psychiatric Hospitalization faxed to:   [facility] ([phone]-or- [phone]),   [facility] ([phone]),   [facility] ([phone] -or- [phone]),   [facility] ([phone]---[phone]---[phone]),   [facility] ([phone] or [phone])   [facility] ([phone] or [phone]),   [facility] ([phone]) 

## 2021-02-10 ENCOUNTER — Encounter: Payer: Self-pay | Admitting: Child and Adolescent Psychiatry

## 2021-02-10 ENCOUNTER — Ambulatory Visit (INDEPENDENT_AMBULATORY_CARE_PROVIDER_SITE_OTHER): Payer: No Typology Code available for payment source | Admitting: Child and Adolescent Psychiatry

## 2021-02-10 ENCOUNTER — Other Ambulatory Visit: Payer: Self-pay

## 2021-02-10 VITALS — BP 119/82 | HR 94 | Temp 98.8°F | Ht 69.0 in | Wt 222.0 lb

## 2021-02-10 DIAGNOSIS — F419 Anxiety disorder, unspecified: Secondary | ICD-10-CM

## 2021-02-10 DIAGNOSIS — F2089 Other schizophrenia: Secondary | ICD-10-CM | POA: Diagnosis not present

## 2021-02-10 MED ORDER — BUSPIRONE HCL 10 MG PO TABS: ORAL_TABLET | ORAL | 1 refills | Status: AC

## 2021-02-10 MED ORDER — ARIPIPRAZOLE ER 400 MG IM SRER
400.0000 mg | INTRAMUSCULAR | 1 refills | Status: AC
Start: 2021-02-10 — End: ?
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
## 2021-03-01 ENCOUNTER — Other Ambulatory Visit: Payer: Self-pay

## 2021-03-01 ENCOUNTER — Telehealth: Payer: Self-pay

## 2021-03-01 ENCOUNTER — Ambulatory Visit: Payer: No Typology Code available for payment source

## 2021-03-01 NOTE — Telephone Encounter (Signed)
Medication refill request - Telephone call with pt's Mother, after she left a message to inform her that the CMA for the office had completed pt's needed prior authorization for his prescribed Abilify Maintena so her pharmacy should be able to fill the medication now.  Collateral to check with their [facility] to make sure they are able to fill the medication this date.

## 2021-03-01 NOTE — Telephone Encounter (Signed)
gave info process went throw. should be $0 cost.

## 2021-03-01 NOTE — Telephone Encounter (Signed)
they states that they do have that it needs a prior [place]

## 2021-03-01 NOTE — Telephone Encounter (Signed)
called and did a priro [place] .   approved from today until 02-24-22 PA # [id number] ref ID # [id number]

## 2021-03-01 NOTE — Telephone Encounter (Signed)
pt mother called pharmacy does not have rx.

## 2021-03-04 ENCOUNTER — Ambulatory Visit (INDEPENDENT_AMBULATORY_CARE_PROVIDER_SITE_OTHER): Payer: No Typology Code available for payment source | Admitting: Child and Adolescent Psychiatry

## 2021-03-04 ENCOUNTER — Emergency Department
Admission: EM | Admit: 2021-03-04 | Discharge: 2021-03-04 | Disposition: A | Discharge status: Home / Self Care | Payer: No Typology Code available for payment source | Attending: Emergency Medicine | Admitting: Emergency Medicine

## 2021-03-04 ENCOUNTER — Ambulatory Visit: Payer: No Typology Code available for payment source

## 2021-03-04 ENCOUNTER — Other Ambulatory Visit: Payer: Self-pay

## 2021-03-04 ENCOUNTER — Encounter: Payer: Self-pay | Admitting: Child and Adolescent Psychiatry

## 2021-03-04 ENCOUNTER — Emergency Department (EMERGENCY_DEPARTMENT_HOSPITAL)
Admission: EM | Admit: 2021-03-04 | Discharge: 2021-03-06 | Disposition: A | Discharge status: Home / Self Care | Payer: No Typology Code available for payment source | Source: Home / Self Care | Attending: Emergency Medicine | Admitting: Emergency Medicine

## 2021-03-04 VITALS — BP 159/84 | HR 99 | Temp 98.7°F | Ht 69.0 in | Wt 214.8 lb

## 2021-03-04 DIAGNOSIS — F1290 Cannabis use, unspecified, uncomplicated: Secondary | ICD-10-CM | POA: Insufficient documentation

## 2021-03-04 DIAGNOSIS — Z046 Encounter for general psychiatric examination, requested by authority: Secondary | ICD-10-CM | POA: Insufficient documentation

## 2021-03-04 DIAGNOSIS — Z20822 Contact with and (suspected) exposure to covid-19: Secondary | ICD-10-CM | POA: Insufficient documentation

## 2021-03-04 DIAGNOSIS — F2089 Other schizophrenia: Secondary | ICD-10-CM

## 2021-03-04 DIAGNOSIS — F209 Schizophrenia, unspecified: Secondary | ICD-10-CM | POA: Insufficient documentation

## 2021-03-04 DIAGNOSIS — F203 Undifferentiated schizophrenia: Secondary | ICD-10-CM | POA: Diagnosis not present

## 2021-03-04 DIAGNOSIS — Z5321 Procedure and treatment not carried out due to patient leaving prior to being seen by health care provider: Secondary | ICD-10-CM | POA: Insufficient documentation

## 2021-03-04 LAB — CBC
HCT: 46.6 % (ref 39.0–52.0)
Hemoglobin: 15.3 g/dL (ref 13.0–17.0)
MCH: 26.1 pg (ref 26.0–34.0)
MCHC: 32.8 g/dL (ref 30.0–36.0)
MCV: 79.4 fL — ABNORMAL LOW (ref 80.0–100.0)
Platelets: 341 10*3/uL (ref 150–400)
RBC: 5.87 MIL/uL — ABNORMAL HIGH (ref 4.22–5.81)
RDW: 14.2 % (ref 11.5–15.5)
WBC: 11.2 10*3/uL — ABNORMAL HIGH (ref 4.0–10.5)
nRBC: 0 % (ref 0.0–0.2)

## 2021-03-04 LAB — URINE DRUG SCREEN, QUALITATIVE (ARMC ONLY)
Amphetamines, Ur Screen: NOT DETECTED
Barbiturates, Ur Screen: NOT DETECTED
Benzodiazepine, Ur Scrn: NOT DETECTED
Cannabinoid 50 Ng, Ur ~~LOC~~: POSITIVE — AB
Cocaine Metabolite,Ur ~~LOC~~: NOT DETECTED
MDMA (Ecstasy)Ur Screen: NOT DETECTED
Methadone Scn, Ur: NOT DETECTED
Opiate, Ur Screen: NOT DETECTED
Phencyclidine (PCP) Ur S: NOT DETECTED
Tricyclic, Ur Screen: NOT DETECTED

## 2021-03-04 LAB — COMPREHENSIVE METABOLIC PANEL
ALT: 31 U/L (ref 0–44)
AST: 31 U/L (ref 15–41)
Albumin: 5 g/dL (ref 3.5–5.0)
Alkaline Phosphatase: 64 U/L (ref 38–126)
Anion gap: 10 (ref 5–15)
BUN: 12 mg/dL (ref 6–20)
CO2: 23 mmol/L (ref 22–32)
Calcium: 9.7 mg/dL (ref 8.9–10.3)
Chloride: 105 mmol/L (ref 98–111)
Creatinine, Ser: 0.9 mg/dL (ref 0.61–1.24)
GFR, Estimated: >60 mL/min (ref >60)
Glucose, Bld: 90 mg/dL (ref 70–99)
Potassium: 3.5 mmol/L (ref 3.5–5.1)
Sodium: 138 mmol/L (ref 135–145)
Total Bilirubin: 1 mg/dL (ref 0.3–1.2)
Total Protein: 8.6 g/dL — ABNORMAL HIGH (ref 6.5–8.1)

## 2021-03-04 LAB — RESP PANEL BY RT-PCR (FLU A&B, COVID) ARPGX2
Influenza A by PCR: NEGATIVE
Influenza B by PCR: NEGATIVE
SARS Coronavirus 2 by RT PCR: NEGATIVE

## 2021-03-04 LAB — ETHANOL: Alcohol, Ethyl (B): <10 mg/dL (ref <10)

## 2021-03-04 MED ORDER — LORAZEPAM 2 MG/ML IJ SOLN
2.0000 mg | Freq: Once | INTRAMUSCULAR | Status: AC
Start: 2021-03-04 — End: 2021-03-04
  Administered 2021-03-04: 2 mg via INTRAMUSCULAR

## 2021-03-04 MED ORDER — ARIPIPRAZOLE 10 MG PO TABS
20.0000 mg | ORAL_TABLET | Freq: Every day | ORAL | Status: DC
  Administered 2021-03-04 – 2021-03-06 (×3): 20 mg via ORAL
  Filled 2021-03-04 (×5): qty 2

## 2021-03-04 MED ORDER — BUSPIRONE HCL 10 MG PO TABS
10.0000 mg | ORAL_TABLET | Freq: Two times a day (BID) | ORAL | Status: DC
Start: 2021-03-04 — End: 2021-03-06
  Administered 2021-03-04 – 2021-03-06 (×3): 10 mg via ORAL
  Filled 2021-03-04 (×3): qty 2

## 2021-03-04 MED ORDER — ZIPRASIDONE MESYLATE 20 MG IM SOLR
20.0000 mg | Freq: Once | INTRAMUSCULAR | Status: AC
Start: 2021-03-04 — End: 2021-03-04
  Administered 2021-03-04: 20 mg via INTRAMUSCULAR

## 2021-03-04 NOTE — BH Assessment (Signed)
Patient to be reviewed with BMU tomorrow 03/05/20

## 2021-03-04 NOTE — ED Notes (Signed)
IVC consult complete recommends inpatient
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
## 2021-03-04 NOTE — BH Assessment (Signed)
Patient presented to the ED agitated and given IM injection. Patient unable to assess. Per patient's psychiatrist, Dr. Jerold Coombe, patient has been recommended for inpatient psychiatric admission.

## 2021-03-04 NOTE — ED Notes (Signed)
Pt was given IM injections while in forensic restraints and with 3 [employer] officers and 2 security at bedside- pt took medications with no resistance from this RN and Elana, RN

## 2021-03-04 NOTE — ED Triage Notes (Addendum)
Pt to ED brought by nurse from Dr. Daylene Katayama office for evaluation by Dr. Cleda Daub for pt's behavior. Pt stating he smokes heroin and weed but denies any use recently. Pt denies SI/HI. Pt refusing to get dressed out.
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
## 2021-03-04 NOTE — ED Provider Notes (Signed)
° °  [facility] Provider Note    Event Date/Time   First MD Initiated Contact with Patient 03/04/21 1546     (approximate)  History   Chief Complaint: Agitation, schizophrenia  HPI  Dennis Dodson is a 21 y.o. male with a past medical history of schizophrenia, TBI, seizure disorder, presents to the emergency department for acute agitation under IVC.  Upon arrival to the emergency department multiple police officers and security are waiting as patient per report is extremely agitated.  And coming into the emergency department patient appears to be quite agitated, yelling loudly and refusing to follow commands.  We will dose IM Geodon and Ativan for staff as well as patient's safety.  Physical Exam   General: Initially agitated upon arrival, yelling loudly.  Now calm cooperative after sedation.  Awake no distress.  No complaints.  States he is ready to go home. CV:  Good peripheral perfusion.  Regular rate and rhythm  Resp:  Normal effort.  Equal breath sounds bilaterally.  Abd:  No distention.  Soft, nontender.  No rebound or guarding.    MEDICATIONS ORDERED IN ED: Medications  ziprasidone (GEODON) injection 20 mg (20 mg Intramuscular Given 03/04/21 1554)  LORazepam (ATIVAN) injection 2 mg (2 mg Intramuscular Given 03/04/21 1554)     IMPRESSION / MDM / ASSESSMENT AND PLAN / ED COURSE  I reviewed the triage vital signs and the nursing notes.  Patient presents emergency department under IVC.  Currently awaiting IVC paperwork but per report patient acutely agitated.  Upon arrival patient remains agitated yelling loudly, largely not cooperative.  Patient was given Geodon and Ativan IM sedation he is currently calm, no complaints.  He is awake and talking.  States he is ready go home.  No concerning findings on physical exam.  We will check labs have psychiatry evaluate.  We will maintain the IVC until psychiatry can help with disposition.  I reviewed the patient's  lab work from earlier today when he was seen in the emergency department but left prior to treatment.  No acute findings.  We will hold off on additional lab work but will check a urine sample for urine drug screen.  Awaiting psychiatric evaluation.  Psychiatry attempted to evaluate but they state the patient was too groggy they will reevaluate in the morning.  FINAL CLINICAL IMPRESSION(S) / ED DIAGNOSES   Agitation Schizophrenia   Note:  This document was prepared using Dragon voice recognition software and may include unintentional dictation errors.   Minna Antis, MD 03/04/21 [phone]

## 2021-03-04 NOTE — ED Notes (Signed)
Pt dressed out into hospital scrubs, pt belongings to include:  1 black and white t-shirt 1 pair tan pants 1 pair black slides 1 black sock 1 white sock
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
## 2021-03-04 NOTE — ED Notes (Addendum)
Pt repeatedly stating, "I'm not staying, I'm leaving." Pt remains voluntary at this time. Pt still denies SI/HI. RN trying to assure pt he would be seen by EDP and psychiatrist. Pt got up and walked out of room and into lobby. This RN trying to assist pt back to triage room and pt not cooperating. Pt leaving ER   RN looked up notes from Dr. Jackson Latino with no indication pt was SI/HI.

## 2021-03-04 NOTE — ED Notes (Signed)
IVC 

## 2021-03-04 NOTE — ED Notes (Signed)
Pt continues to show signs of uneasiness and guarded toward staff as from report. Pt cooperative with this nurse. Questions when he will leave, when psych will assess him, and how long he will be here. Pt has no requests physically and returns to bed when nurses leave, pt covers self in blanket. Light off and door closed

## 2021-03-04 NOTE — ED Notes (Signed)
Report received from Ariel, RN. Patient currently sleeping, respirations regular and unlabored. Q15 minute rounds and observation by Rover and Officer to continue. Will assess patient once awake.  ?

## 2021-03-05 MED ORDER — DIPHENHYDRAMINE HCL 50 MG/ML IJ SOLN
50.0000 mg | Freq: Once | INTRAMUSCULAR | Status: DC
  Filled 2021-03-05: qty 1

## 2021-03-05 MED ORDER — HALOPERIDOL LACTATE 5 MG/ML IJ SOLN
5.0000 mg | Freq: Once | INTRAMUSCULAR | Status: DC
  Filled 2021-03-05: qty 1

## 2021-03-05 MED ORDER — DIPHENHYDRAMINE HCL 50 MG/ML IJ SOLN
50.0000 mg | Freq: Once | INTRAMUSCULAR | Status: AC
  Administered 2021-03-05: 50 mg via INTRAMUSCULAR

## 2021-03-05 MED ORDER — LORAZEPAM 2 MG PO TABS
2.0000 mg | ORAL_TABLET | Freq: Once | ORAL | Status: AC
  Administered 2021-03-05: 2 mg via ORAL
  Filled 2021-03-05: qty 1

## 2021-03-05 MED ORDER — HALOPERIDOL LACTATE 5 MG/ML IJ SOLN
INTRAMUSCULAR | Status: AC
  Filled 2021-03-05: qty 1

## 2021-03-05 MED ORDER — ZIPRASIDONE MESYLATE 20 MG IM SOLR
20.0000 mg | Freq: Once | INTRAMUSCULAR | Status: AC
  Administered 2021-03-05: 20 mg via INTRAMUSCULAR
  Filled 2021-03-05: qty 20

## 2021-03-05 MED ORDER — HALOPERIDOL LACTATE 5 MG/ML IJ SOLN
5.0000 mg | Freq: Once | INTRAMUSCULAR | Status: AC
Start: 2021-03-05 — End: 2021-03-05
  Administered 2021-03-05: 5 mg via INTRAMUSCULAR

## 2021-03-05 MED ORDER — LORAZEPAM 2 MG/ML IJ SOLN
2.0000 mg | Freq: Once | INTRAMUSCULAR | Status: AC
Start: 2021-03-05 — End: 2021-03-05
  Administered 2021-03-05: 2 mg via INTRAMUSCULAR
  Filled 2021-03-05: qty 1

## 2021-03-05 MED ORDER — DROPERIDOL 2.5 MG/ML IJ SOLN
10.0000 mg | Freq: Once | INTRAMUSCULAR | Status: AC
Start: 2021-03-05 — End: 2021-03-05
  Administered 2021-03-05: 10 mg via INTRAMUSCULAR

## 2021-03-05 MED ORDER — DIPHENHYDRAMINE HCL 50 MG/ML IJ SOLN
INTRAMUSCULAR | Status: AC
  Filled 2021-03-05: qty 1

## 2021-03-05 NOTE — ED Notes (Signed)
Pt awake at this time, pt unhappy that he is here, wants to leave. Has woken up multiple times through the night wanting to leave but returned to bed. Pt wants to be DC now and states that his rights have been wronged. Pt unhappy and blunt with staff. Now in room yelling and throwing items. Stating "man, fuck this"

## 2021-03-05 NOTE — ED Notes (Signed)
Dinner tray given. No other needs found at this moment.  ?

## 2021-03-05 NOTE — ED Notes (Signed)
Pt takes injection willingly, pt educated on situation again and plan of care. Pt stays in hallway and communicates with this nurse.

## 2021-03-05 NOTE — ED Notes (Signed)
IVC  [facility]  WAITLIST 
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
## 2021-03-05 NOTE — ED Provider Notes (Signed)
Emergency Medicine Observation Re-evaluation Note  Dennis Dodson is a 21 y.o. male, seen on rounds today.  Pt initially presented to the ED for complaints of IVC Currently, the patient is agitated, paranoid, psychotic.  Physical Exam  BP (!) 149/82 (BP Location: Left Arm)    Pulse 99    Temp 98.7 F (37.1 C) (Oral)    Resp 16    SpO2 100%  Physical Exam Gen: Agitated and not able to be redirected Resp: Normal rise and fall of chest Neuro: Moving all four extremities Psych: Agitated, psychotic  Patient repeatedly stating "I am not Dennis Dodson".  Stating that he is going to "change my name".  Repeatedly stating that he wants to leave.  He is under full IVC.  Not able to redirect him.  ED Course / MDM  EKG:   I have reviewed the labs performed to date as well as medications administered while in observation.  Recent changes in the last 24 hours include increasing agitation requiring IM Geodon.  Plan  Current plan is for psychiatric disposition. Dennis Dodson is under involuntary commitment.      Adreena Willits, Delice Bison, DO 03/05/21 [phone]

## 2021-03-05 NOTE — ED Notes (Signed)
Pt back to room at this time, cooperative and calm.

## 2021-03-05 NOTE — BH Assessment (Signed)
Patient referred to [facility], pending review.   Verbal screening completed with [facility]([phone]), information faxed and confirmed it was received.       

## 2021-03-05 NOTE — BH Assessment (Addendum)
Patient referred to [facility], pending review.   This Probation officer received phone call from nurse requesting information about patient's TBI. Writer faxed over ED notes as requested by facility. Patient continues to be under review at this time  Update 03/06/21 5:00am: This Probation officer contacted facility and spoke with Audelia Acton who reports that patient is still under review at this time

## 2021-03-05 NOTE — ED Notes (Signed)
Report received from Katie, RN including SBAR. Patient alert and oriented, warm and dry, and in no acute distress. Patient denies SI, HI, AVH and pain. Patient made aware of Q15 minute rounds and Rover and Officer presence for their safety. Patient instructed to come to this nurse with needs or concerns.  ?

## 2021-03-05 NOTE — ED Notes (Signed)
Injections administered willingly by patient. Pt had suddenly escalated and came out of room wanting to leave and stating to discharge him immediately. Pt continued to escalate, this nurse and [employer] officer in quad attempted verbal deescalation with pt. Pt escalated to the point of headbutting the wall multiple times and punching the wall several times. Pt after this steps up very close to both members working on deescalation. Pt eventually after talking with staff does calm down and has questions answered. Pt sits on floor and officer speaks with him and is able to help pt relax. Orders were placed by Dr. Cheri Fowler, still administered because of patients labile emotions and desire to leave. Pt assisted with cleaning room of some trash, provided with warm blanket. Pt has no needs when nurse exits room

## 2021-03-05 NOTE — ED Notes (Signed)
Pt gave back all shower supplies. 

## 2021-03-05 NOTE — BH Assessment (Signed)
Referral information for Psychiatric Hospitalization faxed to;   [facility] [phone]- [phone])  [facility] [phone]- [phone]),   [facility] [phone]),  [place] [phone], [phone], [phone] or [phone]),   [place] [phone] or [phone])  [facility] [phone]),   [facility] [phone] -or- [phone]),   [facility] [phone]).

## 2021-03-05 NOTE — ED Notes (Signed)
Pt is continuously escalating, pt out of room, pacing in hallway, pt unhappy and stating he is going to leave. Security presence is here in quad for safety. Pt continues to escalate despite attempts at verbal deescalation. Dr. Elesa Massed notified, to unit to speak with pt.

## 2021-03-05 NOTE — ED Notes (Signed)
IVC Complete/ will reveal am

## 2021-03-05 NOTE — ED Notes (Addendum)
Pt. Requested sandwich tray and water to drink. It was given to pt.
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
## 2021-03-05 NOTE — ED Notes (Signed)
On initial round after report Pt is resting quietly in room without any s/s of distress.  Will continue to monitor throughout shift as ordered for any changes in behaviors and for continued safety.  °

## 2021-03-05 NOTE — ED Notes (Signed)
Pt given Shower supplies and new clothing. New bedding put on bed

## 2021-03-06 MED ORDER — LORAZEPAM 2 MG/ML IJ SOLN
2.0000 mg | Freq: Once | INTRAMUSCULAR | Status: AC
  Administered 2021-03-06: 2 mg via INTRAMUSCULAR
  Filled 2021-03-06: qty 1

## 2021-03-06 MED ORDER — LORAZEPAM 1 MG PO TABS: 1.0000 mg | ORAL_TABLET | Freq: Once | ORAL | Status: DC

## 2021-03-06 MED ORDER — ZIPRASIDONE MESYLATE 20 MG IM SOLR
20.0000 mg | Freq: Once | INTRAMUSCULAR | Status: AC
  Administered 2021-03-06: 20 mg via INTRAMUSCULAR

## 2021-03-06 MED ORDER — DIPHENHYDRAMINE HCL 50 MG/ML IJ SOLN
50.0000 mg | Freq: Once | INTRAMUSCULAR | Status: AC
  Administered 2021-03-06: 50 mg via INTRAMUSCULAR
  Filled 2021-03-06: qty 1

## 2021-03-06 MED ORDER — ZIPRASIDONE MESYLATE 20 MG IM SOLR: 20.0000 mg | Freq: Once | INTRAMUSCULAR | Status: DC

## 2021-03-06 NOTE — ED Notes (Signed)
Pt back out of room, standing in doorway. Pt speaking to officer in the quad, pt continues with bizarre speech and thought process. Pt provided with water per request. Continues to question when he is to be discharged despite many events of eduction on plan of care

## 2021-03-06 NOTE — BH Assessment (Signed)
Patient has been accepted to [facility].  Patient assigned to adult unit Accepting physician is Dr. Estill Cotta.  Call report to [phone]. or [phone].  Representative was [employer].   ER Staff is aware of it:  Ronnie, ER Licensed conveyancer, Patient's Nurse     Unable to reach patient's mother Dennis Dodson-[phone]), the phone continued to ring and no one answered and voicemail didn't pick up for leave a message. The [phone] was a nonworking number.    Address: [address] [place], [place] [address]

## 2021-03-06 NOTE — ED Notes (Signed)
Pt acclimated to unit, staff went over unit guidelines.  Pt denies questions.  Pt currently tearful as well as agitated.  Staff/security allowed space and time for pt to address thoughts/feelings and use self soothing/coping skills

## 2021-03-06 NOTE — ED Notes (Signed)
Pt up and out of room, stating that he wants to leave. Pt is very disorganized and irritable. Pt is able to be deescalated at times but is very labile in the moment. At one point pt puts a cup of water on the floor and squats over the cup and speaks about not putting roots over his family. Pt states he ended witchcraft on his own but they hold it over his hands. Pt then attempts to convince this nurse to let him leave stating different things in an attempt to be discharged. Pt eventually returns to room but can be heard talking to self. Will continue to monitor.

## 2021-03-06 NOTE — ED Notes (Signed)
Pt awake at this time, to restroom, requests writing utensils. Provided at this time

## 2021-03-06 NOTE — ED Notes (Addendum)
Pt still escalating at this time. MD ordered more medication for sedation. Pt is hitting things. Pt lying in the floor refusing to walk to his room. Security and Patent examiner had to drag him to his room. Plan to move to locked unit in BHU once room is cleaned and ready.

## 2021-03-06 NOTE — BH Assessment (Signed)
Writer made several more attempts to contact the patient's parents but was unsuccessful. Attempts were witnessed by Psych NP (Jamison L.).

## 2021-03-06 NOTE — ED Notes (Signed)
Hospital meal provided, pt tolerated w/o complaints.  Waste discarded appropriately.  

## 2021-03-06 NOTE — ED Notes (Addendum)
Pt alone in room hitting glass window that leads into nurse's station.  Window is covered so that pt cannot see into RN's station.  Security onsite -Staff able to de-escalate pt.  Dennis Dodson is now laying in bed & is currently calm

## 2021-03-06 NOTE — ED Notes (Signed)
Pt is verbally yelling, being hostile, threatening to take police officers gun. Unable to redirect. Will admin geodon.
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
## 2021-03-06 NOTE — ED Notes (Signed)
Pt requests paper and crayon, provided

## 2021-03-06 NOTE — ED Provider Notes (Signed)
Emergency Medicine Observation Re-evaluation Note  Dennis Dodson is a 21 y.o. male, seen on rounds today.  Pt initially presented to the ED for complaints of IVC Currently, the patient is resting comfortably without acute complaints.  Physical Exam  BP 137/68 (BP Location: Right Arm)    Pulse 83    Temp 98.5 F (36.9 C)    Resp 20    SpO2 95%  Physical Exam Gen: No acute distress  Resp: Normal rise and fall of chest Neuro: Moving all four extremities Psych: Resting currently, calm and cooperative when awake    ED Course / MDM  EKG:   I have reviewed the labs performed to date as well as medications administered while in observation.  Recent changes in the last 24 hours include -patient did become more agitated overnight but was able to be verbally redirected and did not require IM sedation.  Plan  Current plan is for psychiatric inpatient treatment. Dennis Dodson is under involuntary commitment.      Violet Cart, Delice Bison, DO 03/06/21 [phone]

## 2021-03-06 NOTE — ED Notes (Signed)
Pt has calmed after new officer is to unit, pt in room speaking with him and agrees to sit on bed and relax. IM not administered but held at desk. Will continue to monitor.

## 2021-03-06 NOTE — ED Notes (Signed)
IVC  [facility]  WAITLIST 
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
## 2021-03-06 NOTE — ED Notes (Signed)
Pt back out of room, questioning if he could leave and plan of care. Pt is educated again on plan. Pt unhappy with plan, pacing in hallway talking to this nurse and [employer] officer in quad. Pt states that he feels like he is going to die, when asked why he reports that he thinks his heart is going to stop because he is in the hospital and he does not want to die in the hospital. Pt continues to desire to leave and attempting to make up different things to be able to go. Dr. Katrinka Blazing made aware at this time, no orders.

## 2021-03-06 NOTE — ED Notes (Signed)
Mother called to check on pt. Updated her.  [phone]

## 2021-03-06 NOTE — ED Notes (Signed)
Breakfast tray given. °

## 2021-03-06 NOTE — ED Notes (Signed)
Pt calm and cooperative, staff informed pt of transfer to [facility] facility and pt is agreeable with this.  Cont to monitor until transport arrives

## 2021-03-08 ENCOUNTER — Telehealth: Payer: Self-pay | Admitting: Child and Adolescent Psychiatry

## 2021-03-08 NOTE — Telephone Encounter (Signed)
Over the weekend pt was admitted to [facility] adult unit, per records. I called [facility] Adult unit to speak with his treating psychiatrist/treatment team to collaborate on his treatment. Staff reported Psychiatrist was not available therefore transferred to SW/Therapist, Left VM o requesting a call back.

## 2021-03-10 ENCOUNTER — Telehealth: Payer: Self-pay | Admitting: Child and Adolescent Psychiatry

## 2021-03-10 ENCOUNTER — Encounter: Payer: Self-pay | Admitting: Child and Adolescent Psychiatry

## 2021-03-10 NOTE — Telephone Encounter (Signed)
I called yesterday to [facility] to speak with pt's treatment team to provide recommendations that he needs higher level of treatment such as ACT team or outpatient treatment. However, they reported that they cannot confirm if pt is in the hospital or not because of HIPPA regulations, but agreed to pass on message to pt if he is in the hospital so that he can talk to this Clinical research associate. Pt called from the hospital this morning, and I requested to speak with his treatment team to coordinate his treatment. He declined. His mother called subsequently and reported that he needs to get discharged from the [facility] and asked if writer can help him get discharged. I discussed with her to follow the recommendations of his inpatient team at the [facility]. I did let her know that I have tried to reach out the team there to give my recommendations for higher level of treatment such as ACT team or intensive outpatient treatment vs regular outpatient treatment which he has been failing once he is ready for discharge. Has not heard from [facility]. I discussed these recommendations with her as well. She verbalized understanding.

## 2021-03-10 NOTE — Telephone Encounter (Signed)
Pt's SW, Misty Stanley from [facility] called and reported that mother called and informed her that I have suggested that he would benefit from outpatient treatment rather than inpatient treatment. I informed her that I discussed with mother that discharge decision will be made by his inpatient team and [facility] and I don't have any control over it.   Misty Stanley reports that at the time of his admission he was disorganized and incoherent however he is improving so she has told mother that he is improving and progressing towards the discharge but does not appear ready.  I did provide them the brief history of his presentation and his treatment in this clinic. I also discussed the recommendations for higher level of treatment such as ACT team or Intensive outpatient treatment program as he has been failing with regular outpatient treatment and continuing with regular outpatient treatment would not improve prognosis/outcome. She did report that she is not sure if he will fit the criteria for ACT team but she will make referrals to ACT team and other appropriate resources. I have discussed with her that we are discharging him from the treatment from this clinic as my current recommendation is higher level of treatment post discharge from their treatment at [facility]. She verbalized understanding.

## 2021-03-11 ENCOUNTER — Encounter: Payer: Self-pay | Admitting: Child and Adolescent Psychiatry

## 2021-03-11 NOTE — Progress Notes (Signed)
Updated letter of notification of patient's discharge from the clinic was entered today and will be sent via certified mail.

## 2021-03-29 ENCOUNTER — Other Ambulatory Visit: Payer: Self-pay | Admitting: Child and Adolescent Psychiatry

## 2021-05-03 ENCOUNTER — Other Ambulatory Visit: Payer: Self-pay | Admitting: Child and Adolescent Psychiatry

## 2021-05-04 ENCOUNTER — Other Ambulatory Visit: Payer: Self-pay | Admitting: Child and Adolescent Psychiatry

## 2021-09-14 ENCOUNTER — Emergency Department
Admission: EM | Admit: 2021-09-14 | Discharge: 2021-09-14 | Disposition: A | Discharge status: Home / Self Care | Payer: Medicaid Other | Attending: Student in an Organized Health Care Education/Training Program | Admitting: Student in an Organized Health Care Education/Training Program

## 2021-09-14 ENCOUNTER — Other Ambulatory Visit: Payer: Self-pay

## 2021-09-14 ENCOUNTER — Encounter: Payer: Self-pay | Admitting: Emergency Medicine

## 2021-09-14 DIAGNOSIS — S59911A Unspecified injury of right forearm, initial encounter: Secondary | ICD-10-CM | POA: Diagnosis present

## 2021-09-14 DIAGNOSIS — W228XXA Striking against or struck by other objects, initial encounter: Secondary | ICD-10-CM | POA: Diagnosis not present

## 2021-09-14 DIAGNOSIS — S51811A Laceration without foreign body of right forearm, initial encounter: Secondary | ICD-10-CM | POA: Diagnosis not present

## 2021-09-14 DIAGNOSIS — J45909 Unspecified asthma, uncomplicated: Secondary | ICD-10-CM | POA: Insufficient documentation

## 2021-09-14 MED ORDER — LIDOCAINE HCL (PF) 1 % IJ SOLN
5.0000 mL | Freq: Once | INTRAMUSCULAR | Status: AC
  Administered 2021-09-14: 5 mL via INTRADERMAL
  Filled 2021-09-14: qty 5
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
## 2021-09-14 NOTE — Discharge Instructions (Signed)
Do not get the sutured area wet for 24 hours. After 24 hours, shower/bathe as usual and pat the area dry. °Change the bandage 2 times per day and apply antibiotic ointment. °Leave open to air when at no risk of getting the area dirty, but cover at night before bed. °See your PCP or go to Urgent Care in 10 days for suture removal or sooner for signs or concern of infection. ° °
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
## 2021-09-17 NOTE — ED Provider Notes (Signed)
[facility] Provider Note    Event Date/Time   First MD Initiated Contact with Patient 09/14/21 1025     (approximate)   History   Laceration   HPI  Dennis Dodson is a 21 y.o. male  with history as listed below presents to the ER for treatment of laceration to the right forearm after punching window. Bleeding well controlled. Tdap is current.  Past Medical History:  Diagnosis Date   Altered mental status 04/02/2019   Asthma    Hypersomnolence 08/29/2018   Mood disorder (HCC) 07/24/2019   Mood disturbance 08/29/2018   Other schizophrenia (HCC) 03/02/2020   Seizures (HCC)    TBI (traumatic brain injury) [facility])      Physical Exam   Triage Vital Signs: ED Triage Vitals  Enc Vitals Group     BP 09/14/21 1010 (!) 122/103     Pulse Rate 09/14/21 1010 77     Resp 09/14/21 1010 16     Temp 09/14/21 1010 98.7 F (37.1 C)     Temp Source 09/14/21 1010 Oral     SpO2 09/14/21 1010 98 %     Weight 09/14/21 1011 220 lb (99.8 kg)     Height 09/14/21 1011 5\' 9"  (1.753 m)     Head Circumference --      Peak Flow --      Pain Score 09/14/21 1011 2     Pain Loc --      Pain Edu? --      Excl. in GC? --     Most recent vital signs: Vitals:   09/14/21 1010  BP: (!) 122/103  Pulse: 77  Resp: 16  Temp: 98.7 F (37.1 C)  SpO2: 98%    General: Awake, no distress.  CV:  Good peripheral perfusion.  Resp:  Normal effort.  Abd:  No distention.  Other:  Stellate laceration/skin tear to the volar/lateral right forearm   ED Results / Procedures / Treatments   Labs (all labs ordered are listed, but only abnormal results are displayed) Labs Reviewed - No data to display   EKG     RADIOLOGY  Not indicated.  I have independently reviewed and interpreted imaging as well as reviewed report from radiology.  PROCEDURES:  Critical Care performed: No  ..Laceration Repair  Date/Time: 09/17/2021 9:27 AM  Performed by: 09/19/2021,  FNP Authorized by: Chinita Pester, FNP   Consent:    Consent obtained:  Verbal   Consent given by:  Patient and parent   Risks discussed:  Infection, poor cosmetic result and poor wound healing Universal protocol:    Patient identity confirmed:  Verbally with patient Laceration details:    Location:  Shoulder/arm   Shoulder/arm location:  R lower arm   Length (cm):  6 Pre-procedure details:    Preparation:  Patient was prepped and draped in usual sterile fashion Exploration:    Imaging outcome: foreign body not noted     Wound exploration: entire depth of wound visualized   Treatment:    Area cleansed with:  Povidone-iodine and saline   Amount of cleaning:  Standard   Irrigation method:  Syringe   Visualized foreign bodies/material removed: no   Skin repair:    Repair method:  Sutures   Suture size:  4-0   Suture material:  Nylon   Suture technique:  Simple interrupted   Number of sutures:  6 Approximation:    Approximation:  Close Repair type:  Repair type:  Simple Post-procedure details:    Dressing:  Antibiotic ointment and sterile dressing   Procedure completion:  Tolerated well, no immediate complications    MEDICATIONS ORDERED IN ED:  Medications  lidocaine (PF) (XYLOCAINE) 1 % injection 5 mL (5 mLs Intradermal Given by Other 09/14/21 1046)     IMPRESSION / MDM / ASSESSMENT AND PLAN / ED COURSE   I reviewed the triage vital signs and the nursing notes.  Differential diagnosis includes, but is not limited to: Laceration; retained foreign body; vascular injury  Patient's presentation is most consistent with acute, uncomplicated illness.  21 year old male presents to the ER after punching through a window. See HPI.  Wound cleaned and repaired as above. Wound care instructions discussed with patient and family. Sutures to be removed in 10 days by PCP or urgent care.      FINAL CLINICAL IMPRESSION(S) / ED DIAGNOSES   Final diagnoses:  Laceration of  right forearm, initial encounter     Rx / DC Orders   ED Discharge Orders     None        Note:  This document was prepared using Dragon voice recognition software and may include unintentional dictation errors.   Chinita Pester, FNP 09/17/21 [phone]    Willy Eddy, MD 09/18/21 [phone]

## 2021-10-25 ENCOUNTER — Emergency Department
Admission: EM | Admit: 2021-10-25 | Discharge: 2021-10-25 | Disposition: A | Discharge status: Home / Self Care | Payer: Medicaid Other | Attending: Emergency Medicine | Admitting: Emergency Medicine

## 2021-10-25 ENCOUNTER — Other Ambulatory Visit: Payer: Self-pay

## 2021-10-25 ENCOUNTER — Encounter: Payer: Self-pay | Admitting: Emergency Medicine

## 2021-10-25 DIAGNOSIS — K0889 Other specified disorders of teeth and supporting structures: Secondary | ICD-10-CM

## 2021-10-25 MED ORDER — IBUPROFEN 800 MG PO TABS: 800.0000 mg | ORAL_TABLET | Freq: Three times a day (TID) | ORAL | 0 refills | Status: AC | PRN

## 2021-10-25 MED ORDER — AMOXICILLIN 500 MG PO CAPS: 500.0000 mg | ORAL_CAPSULE | Freq: Three times a day (TID) | ORAL | 0 refills | Status: DC

## 2021-10-25 NOTE — Discharge Instructions (Signed)
OPTIONS FOR DENTAL FOLLOW UP CARE ° °[place] [facility] and Human Services - Local Safety Net Dental Clinics °http://www.ncdhhs.gov/dph/oralhealth/services/safetynetclinics.htm °  °[facility] (336-562-3123) ° °[facility] [place] (919-933-9087) ° °[facility] ([phone] ext [phone]) ° °[facility] ([phone]) ° °[facility] ([phone]) °This clinic caters to the indigent population and is on a lottery system. °Location: °[facility], [address], [place] °Clinic Hours: °Wednesdays from 6pm - 9pm, patients seen by a lottery system. °For dates, call or go to www.med.[facility].edu/[facility]/patients/Dental-[facility] °Services: °Cleanings, fillings and simple extractions. °Payment Options: °DENTAL WORK IS FREE OF CHARGE. Bring proof of income or support. °Best way to get seen: °Arrive at 5:15 pm - this is a lottery, NOT first come/first serve, so arriving earlier will not increase your chances of being seen. °  °  °[facility] °[phone] °Select option 1 for emergencies °  °Location: °[facility], [address], [place] °Clinic Hours: °No walk-ins accepted - call the day before to schedule an appointment. °Check in times are 9:30 am and 1:30 pm. °Services: °Simple extractions, temporary fillings, pulpectomy/pulp debridement, uncomplicated abscess drainage. °Payment Options: °PAYMENT IS DUE AT THE TIME OF SERVICE.  Fee is usually $100-200, additional surgical procedures (e.g. abscess drainage) may be extra. °Cash, checks, Visa/MasterCard accepted.  Can file Medicaid if patient is covered for dental - patient should call case worker to check. °No discount for [facility] patients. °Best way to get seen: °MUST call the day before and get onto the schedule. Can usually be seen the next 1-2 days. No walk-ins accepted. °  °  °[facility] °[phone] °   °Location: °[facility], [address], [place] °Clinic Hours: °M, W, Th, F 8am or 1:30pm, Tues 9a or 1:30 - first come/first served. °Services: °Simple extractions, temporary fillings, uncomplicated abscess drainage.  You do not need to be an [place] resident. °Payment Options: °PAYMENT IS DUE AT THE TIME OF SERVICE. °Dental insurance, otherwise sliding scale - bring proof of income or support. °Depending on income and treatment needed, cost is usually $50-200. °Best way to get seen: °Arrive early as it is first come/first served. °  °  °[facility] °[phone] °  °Location: °[address] °Clinic Hours: °Mon-Thu 8a-5p °Services: °Most basic dental services including extractions and fillings. °Payment Options: °PAYMENT IS DUE AT THE TIME OF SERVICE. °Sliding scale, up to 50% off - bring proof if income or support. °Medicaid with dental option accepted. °Best way to get seen: °Call to schedule an appointment, can usually be seen within 2 weeks OR they will try to see walk-ins - show up at 8a or 2p (you may have to wait). °  °  °[facility] °[phone] °[place] RESIDENTS ONLY °  °Location: °[facility], [address], [place], [place] [address] °Clinic Hours: By appointment only. °Monday - Thursday 8am-5pm, Friday 8am-12pm °Services: Cleanings, fillings, extractions. °Payment Options: °PAYMENT IS DUE AT THE TIME OF SERVICE. °Cash, Visa or MasterCard. Sliding scale - $30 minimum per service. °Best way to get seen: °Come in to office, complete packet and make an appointment - need proof of income °or support monies for each household member and proof of [place] residence. °Usually takes about a month to get in. °  °  °[facility] °[phone] °  °Location: °[address]   [place] °Clinic Hours: Walk-in Urgent Care Dental Services are offered Monday-Friday  mornings only. °The numbers of emergencies accepted daily is limited to the number of °providers available. °Maximum 15 - Mondays, Wednesdays & Thursdays °Maximum 10 - Tuesdays & Fridays °Services: °You do not need to be a [place] resident to be seen for a dental emergency. °Emergencies are defined as pain, swelling, abnormal bleeding, or dental trauma. Walkins will receive x-rays if needed. °NOTE: Dental cleaning is not an emergency. °Payment Options: °PAYMENT IS DUE AT THE TIME OF SERVICE. °Minimum co-pay is $40.00 for uninsured patients. °Minimum co-pay is $3.00 for Medicaid with dental coverage. °Dental Insurance is accepted and must be presented at time of visit. °Medicare does not cover dental. °Forms of payment: Cash, credit card, checks. °Best way to get seen: °If not previously registered with the clinic, walk-in dental registration begins at 7:15 am and is on a first come/first serve basis. °If previously registered with the clinic, call to make an appointment. °  °  °[facility] °[phone] °[place] RESIDENTS ONLY °  °Location: °[address], [place], [place] °Clinic Hours: °Mon-Thu 10a-2p °Services: Extractions only! °Payment Options: °FREE (donations accepted) - bring proof of income or support °Best way to get seen: °Call and schedule an appointment OR come at 8am on the 1st Monday of every month (except for holidays) when it is first come/first served. °  °  °[facility] °[phone] °  °Location: °[address], [place] °Clinic Hours: °Friday mornings °Services, Payment Options, Best way to get seen: °Call for info °

## 2021-10-25 NOTE — ED Triage Notes (Signed)
Pt via POV from home. Pt c/o R lower dental  pain since yesterday, some mild swelling. Pt has not seen dentist. Pt is A&Ox4 and NAD

## 2021-10-25 NOTE — ED Provider Notes (Signed)
[facility] Provider Note    Event Date/Time   First MD Initiated Contact with Patient 10/25/21 1017     (approximate)   History   Dental Pain   HPI  Dennis Dodson is a 21 y.o. male with no significant past medical history presents emergency department complaining of right-sided dental pain.  Patient states it hurts when he swallows.  Symptoms been ongoing for 2 days.  No fever or chills.      Physical Exam   Triage Vital Signs: ED Triage Vitals [10/25/21 0952]  Enc Vitals Group     BP 123/74     Pulse Rate 73     Resp 18     Temp 98.1 F (36.7 C)     Temp Source Oral     SpO2 100 %     Weight 220 lb (99.8 kg)     Height 5\' 9"  (1.753 m)     Head Circumference      Peak Flow      Pain Score 10     Pain Loc      Pain Edu?      Excl. in GC?     Most recent vital signs: Vitals:   10/25/21 0952  BP: 123/74  Pulse: 73  Resp: 18  Temp: 98.1 F (36.7 C)  SpO2: 100%     General: Awake, no distress.   CV:  Good peripheral perfusion. regular rate and  rhythm Resp:  Normal effort.  Abd:  No distention.   Other:  Poor dentition noted, minimal gum swelling noted, throat appears to be normal, neck is supple, no lymphadenopathy   ED Results / Procedures / Treatments   Labs (all labs ordered are listed, but only abnormal results are displayed) Labs Reviewed - No data to display   EKG     RADIOLOGY     PROCEDURES:   Procedures   MEDICATIONS ORDERED IN ED: Medications - No data to display   IMPRESSION / MDM / ASSESSMENT AND PLAN / ED COURSE  I reviewed the triage vital signs and the nursing notes.                              Differential diagnosis includes, but is not limited to, dental caries, dental pain, abscess  Patient's presentation is most consistent with acute, uncomplicated illness.   Patient was given a prescription for amoxicillin and ibuprofen.  He is to follow-up with a dentist.  List of dental  clinics was provided for the patient.  He is to return emergency department worsening.  Patient is in agreement treatment plan.  Discharged stable condition.      FINAL CLINICAL IMPRESSION(S) / ED DIAGNOSES   Final diagnoses:  Pain, dental     Rx / DC Orders   ED Discharge Orders          Ordered    amoxicillin (AMOXIL) 500 MG capsule  3 times daily,   Status:  Discontinued        10/25/21 1024    ibuprofen (ADVIL) 800 MG tablet  Every 8 hours PRN        10/25/21 1024    amoxicillin (AMOXIL) 500 MG capsule  3 times daily        10/25/21 1025             Note:  This document was prepared using Dragon voice recognition software and may include  unintentional dictation errors.    Faythe Ghee, PA-C 10/25/21 1030    Sharyn Creamer, MD 10/25/21 [phone]

## 2021-10-25 NOTE — ED Notes (Signed)
See triage note  Presents with dental pain  States pain is to right lower gum line

## 2021-11-20 IMAGING — CT CT HEAD W/O CM
3 series · 15 of 46 positions shown, 18 images · non-contrast
Comparison: 04/08/2017

CLINICAL DATA: Encephalopathy

EXAM:
CT HEAD WITHOUT CONTRAST
TECHNIQUE: Contiguous axial images were obtained from the base of the skull
through the vertex without intravenous contrast.

[Series 2: head wo · axial · 0.42mm/px · z∈[+723,+843]mm · 9 of 29 slices shown, 12 images]
[im 3/29  brain]
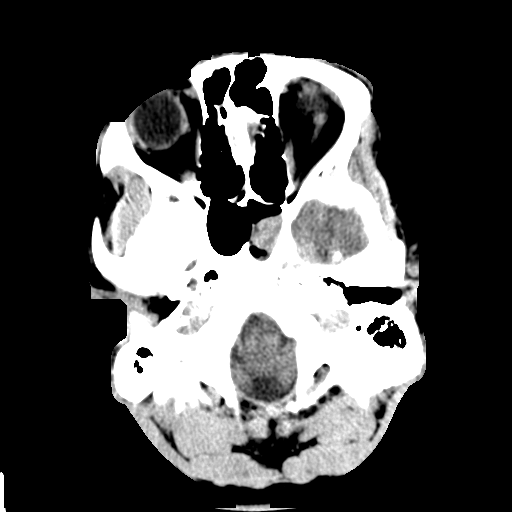
[im 3/29  bone]
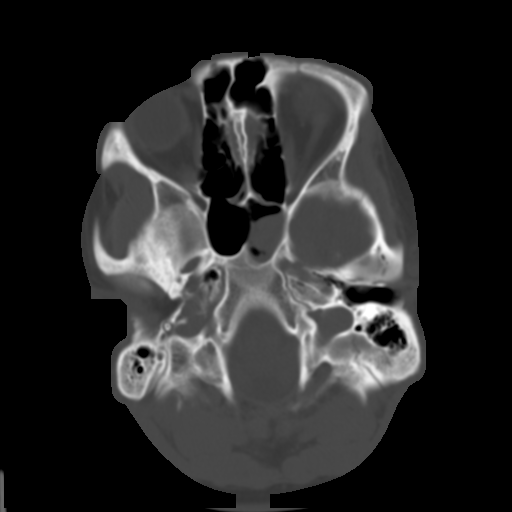
[im 6/29  brain]
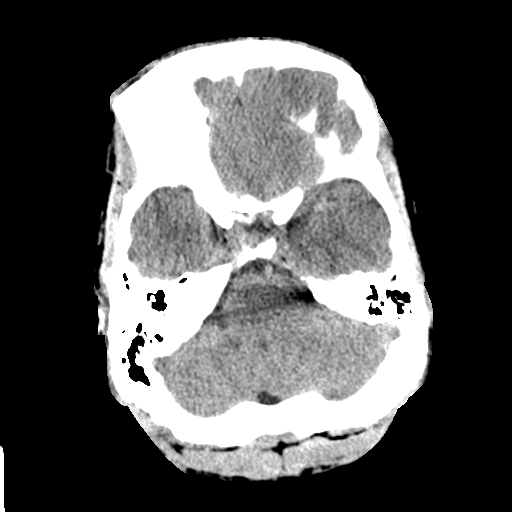
[im 9/29  brain]
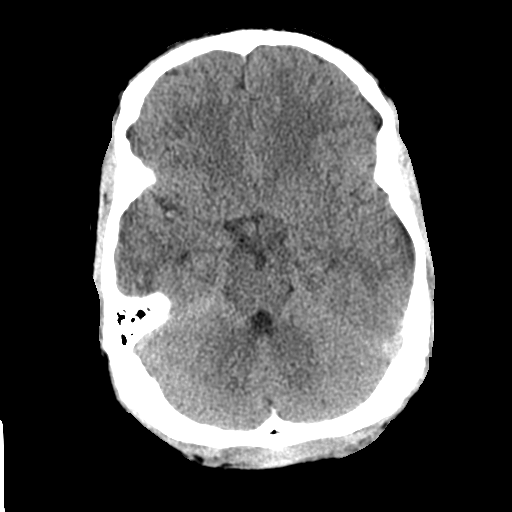
[im 12/29  brain]
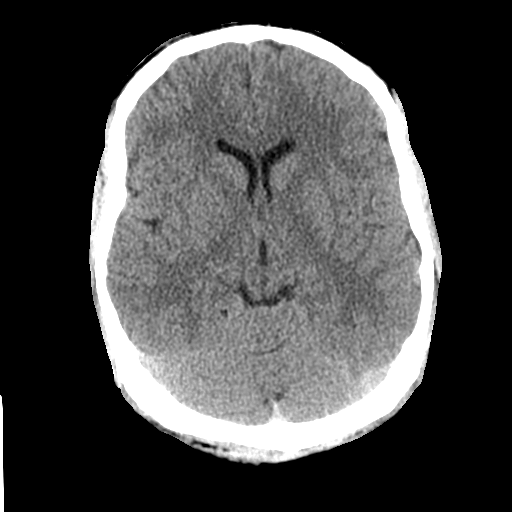
[im 15/29  brain]
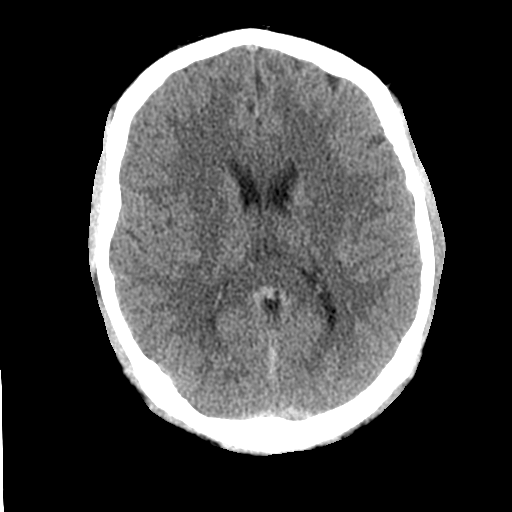
[im 15/29  bone]
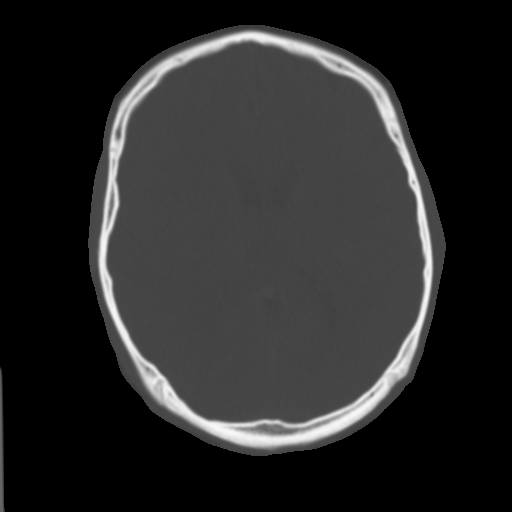
[im 18/29  brain]
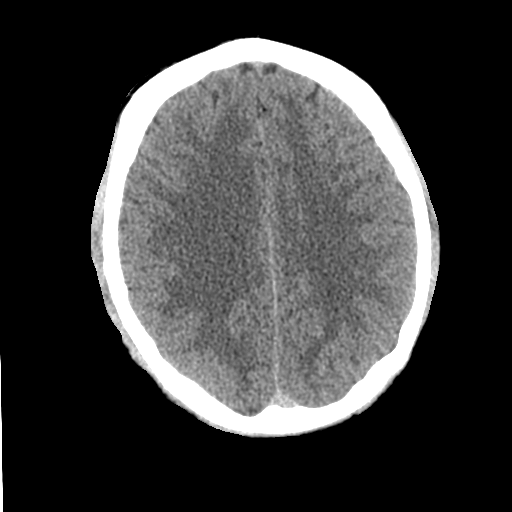
[im 21/29  brain]
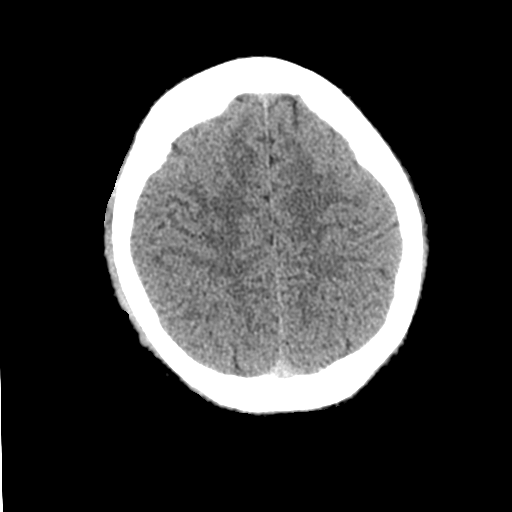
[im 24/29  brain]
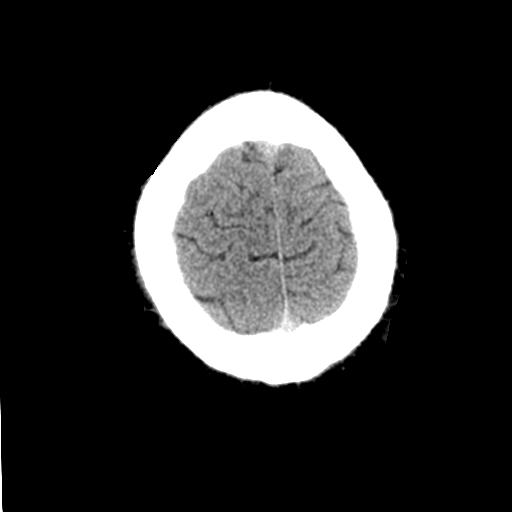
[im 27/29  brain]
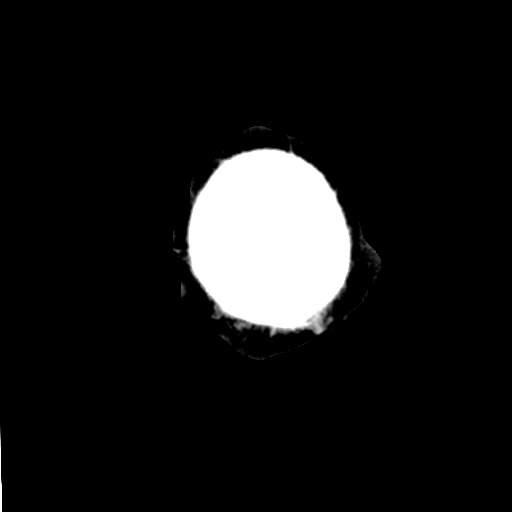
[im 27/29  bone]
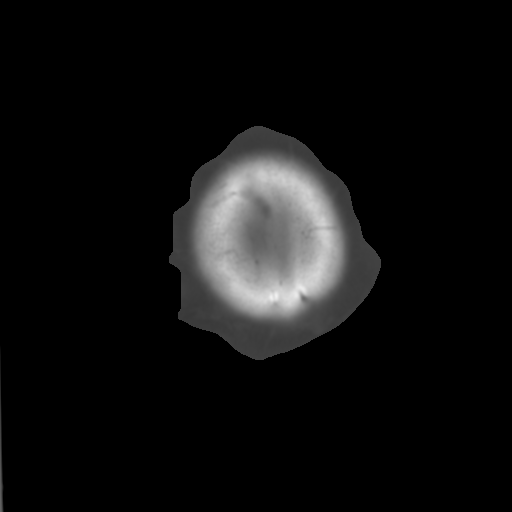

[Series 4: coronal soft tissue · coronal · 0.29mm/px · 3 of 69 slices shown]
[im 23/69  brain]
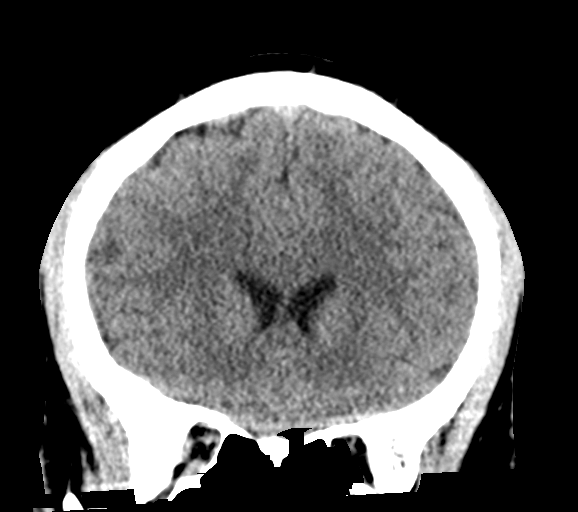
[im 31/69  brain]
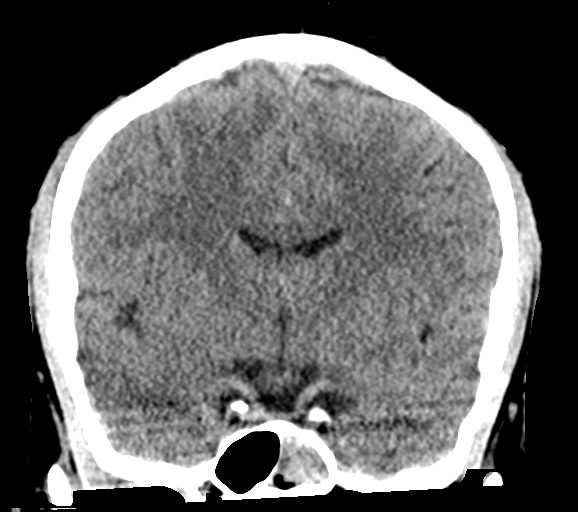
[im 38/69  brain]
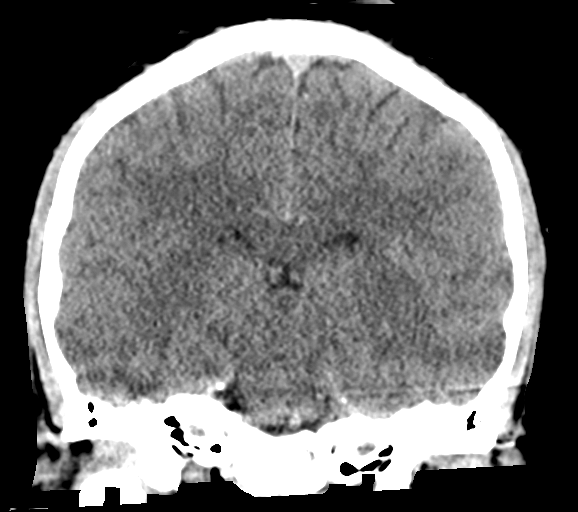

[Series 5: sagittal soft tissue · sagittal · 0.29mm/px · 3 of 56 slices shown]
[im 19/56  brain]
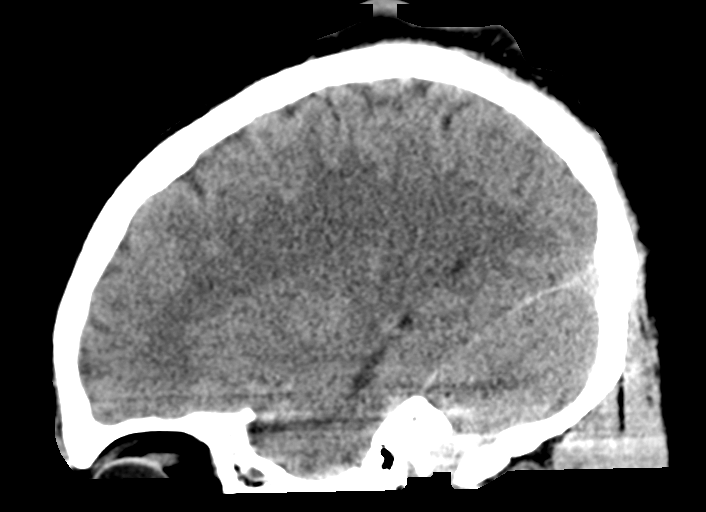
[im 28/56  brain]
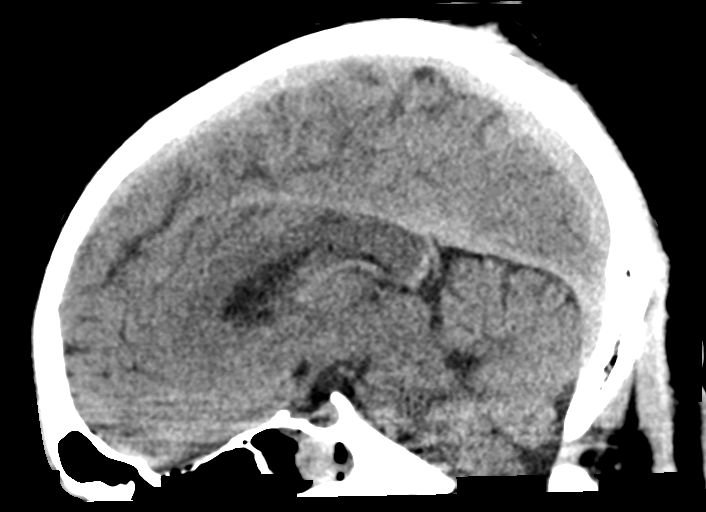
[im 37/56  brain]
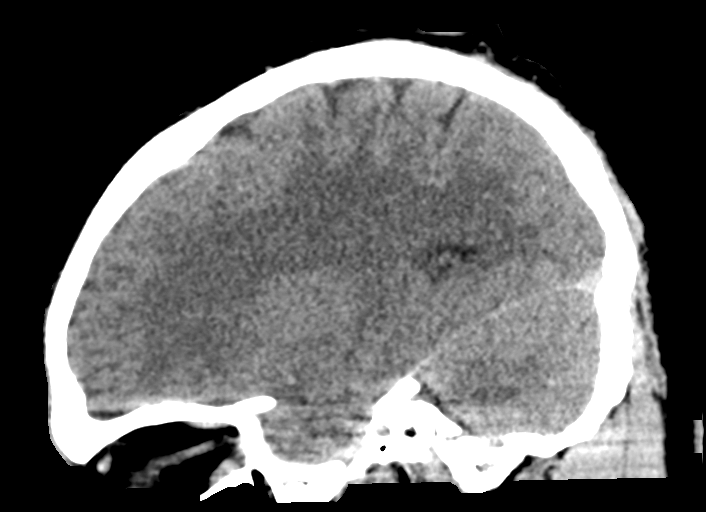

[15 of 46 positions shown; findings below may reference images not displayed]

FINDINGS: Brain: No evidence of acute infarction, hemorrhage, hydrocephalus,
extra-axial collection or mass lesion/mass effect. Bandlike area of
relative hypoattenuation in the right temporal lobe is favored
artifactual related to beam hardening (series 5, image 15).

Vascular: No hyperdense vessel or unexpected calcification.

Skull: Normal. Negative for fracture or focal lesion.

Sinuses/Orbits: Chronic left sphenoid sinus disease with paranasal
sinus versus mucous retention cyst. Remaining paranasal sinuses and
mastoid air cells are clear.

Other: None.
IMPRESSION: 1. No acute intracranial findings.
2. Chronic left sphenoid sinus disease.

## 2022-05-19 ENCOUNTER — Other Ambulatory Visit: Payer: Self-pay

## 2022-05-19 ENCOUNTER — Emergency Department
Admission: EM | Admit: 2022-05-19 | Discharge: 2022-05-19 | Disposition: A | Discharge status: Home / Self Care | Payer: Medicaid Other | Attending: Emergency Medicine | Admitting: Emergency Medicine

## 2022-05-19 ENCOUNTER — Encounter: Payer: Self-pay | Admitting: Intensive Care

## 2022-05-19 DIAGNOSIS — R197 Diarrhea, unspecified: Secondary | ICD-10-CM | POA: Insufficient documentation

## 2022-05-19 DIAGNOSIS — K625 Hemorrhage of anus and rectum: Secondary | ICD-10-CM | POA: Insufficient documentation

## 2022-05-19 LAB — COMPREHENSIVE METABOLIC PANEL
ALT: 28 U/L (ref 0–44)
AST: 27 U/L (ref 15–41)
Albumin: 4.8 g/dL (ref 3.5–5.0)
Alkaline Phosphatase: 61 U/L (ref 38–126)
Anion gap: 10 (ref 5–15)
BUN: 10 mg/dL (ref 6–20)
CO2: 24 mmol/L (ref 22–32)
Calcium: 9.6 mg/dL (ref 8.9–10.3)
Chloride: 103 mmol/L (ref 98–111)
Creatinine, Ser: 0.92 mg/dL (ref 0.61–1.24)
GFR, Estimated: >60 mL/min (ref >60)
Glucose, Bld: 131 mg/dL — ABNORMAL HIGH (ref 70–99)
Potassium: 3.3 mmol/L — ABNORMAL LOW (ref 3.5–5.1)
Sodium: 137 mmol/L (ref 135–145)
Total Bilirubin: 0.9 mg/dL (ref 0.3–1.2)
Total Protein: 8 g/dL (ref 6.5–8.1)

## 2022-05-19 LAB — CBC WITH DIFFERENTIAL/PLATELET
Abs Immature Granulocytes: 0.03 10*3/uL (ref 0.00–0.07)
Basophils Absolute: 0.1 10*3/uL (ref 0.0–0.1)
Basophils Relative: 1 %
Eosinophils Absolute: 0 10*3/uL (ref 0.0–0.5)
Eosinophils Relative: 0 %
HCT: 43.8 % (ref 39.0–52.0)
Hemoglobin: 14.6 g/dL (ref 13.0–17.0)
Immature Granulocytes: 0 %
Lymphocytes Relative: 25 %
Lymphs Abs: 2.4 10*3/uL (ref 0.7–4.0)
MCH: 25.9 pg — ABNORMAL LOW (ref 26.0–34.0)
MCHC: 33.3 g/dL (ref 30.0–36.0)
MCV: 77.8 fL — ABNORMAL LOW (ref 80.0–100.0)
Monocytes Absolute: 0.5 10*3/uL (ref 0.1–1.0)
Monocytes Relative: 6 %
Neutro Abs: 6.8 10*3/uL (ref 1.7–7.7)
Neutrophils Relative %: 68 %
Platelets: 344 10*3/uL (ref 150–400)
RBC: 5.63 MIL/uL (ref 4.22–5.81)
RDW: 14.5 % (ref 11.5–15.5)
WBC: 9.9 10*3/uL (ref 4.0–10.5)
nRBC: 0 % (ref 0.0–0.2)

## 2022-05-19 MED ORDER — FAMOTIDINE 20 MG PO TABS
20.0000 mg | ORAL_TABLET | Freq: Two times a day (BID) | ORAL | 0 refills | Status: AC
Start: 2022-05-19 — End: 2022-05-29

## 2022-05-19 MED ORDER — DIBUCAINE (PERIANAL) 1 % EX OINT: 1.0000 | TOPICAL_OINTMENT | Freq: Three times a day (TID) | CUTANEOUS | 0 refills | Status: AC | PRN

## 2022-05-19 MED ORDER — POTASSIUM CHLORIDE CRYS ER 20 MEQ PO TBCR
40.0000 meq | EXTENDED_RELEASE_TABLET | Freq: Once | ORAL | Status: AC
  Administered 2022-05-19: 40 meq via ORAL
  Filled 2022-05-19: qty 2

## 2022-05-19 NOTE — ED Provider Notes (Signed)
[facility] Emergency Department Provider Note     Event Date/Time   First MD Initiated Contact with Patient 05/19/22 1424     (approximate)   History   Rectal Bleeding   HPI  Dennis Dodson is a 22 y.o. male presents today for evaluation of BRBPR. He has noted symptoms over the last 2 days. He notes several soft stools over the last few days, including watery diarrhea. He admits to eating a lot of hot sauce and Takis. He denies NV, or FCS.         Physical Exam   Triage Vital Signs: ED Triage Vitals  Enc Vitals Group     BP 05/19/22 1308 128/75     Pulse Rate 05/19/22 1308 (!) 114     Resp 05/19/22 1308 16     Temp 05/19/22 1308 98.1 F (36.7 C)     Temp Source 05/19/22 1308 Oral     SpO2 05/19/22 1308 98 %     Weight 05/19/22 1308 216 lb (98 kg)     Height 05/19/22 1308 5\' 9"  (1.753 m)     Head Circumference --      Peak Flow --      Pain Score 05/19/22 1310 1     Pain Loc --      Pain Edu? --      Excl. in [place]? --     Most recent vital signs: Vitals:   05/19/22 1308  BP: 128/75  Pulse: (!) 114  Resp: 16  Temp: 98.1 F (36.7 C)  SpO2: 98%    General Awake, no distress. NAD CV:  Good peripheral perfusion.  RESP:  Normal effort.  ABD:  No distention. Normal rectal tone without evidence of external or internal hemorrhoids. Heme-positive residual stool noted.    ED Results / Procedures / Treatments   Labs (all labs ordered are listed, but only abnormal results are displayed) Labs Reviewed  CBC WITH DIFFERENTIAL/PLATELET - Abnormal; Notable for the following components:      Result Value   MCV 77.8 (*)    MCH 25.9 (*)    All other components within normal limits  COMPREHENSIVE METABOLIC PANEL - Abnormal; Notable for the following components:   Potassium 3.3 (*)    Glucose, Bld 131 (*)    All other components within normal limits     EKG   RADIOLOGY  No results found.   PROCEDURES:  Critical Care  performed: No  Procedures   MEDICATIONS ORDERED IN ED: Medications  potassium chloride SA (KLOR-CON M) CR tablet 40 mEq (40 mEq Oral Given 05/19/22 1449)     IMPRESSION / MDM / ASSESSMENT AND PLAN / ED COURSE  I reviewed the triage vital signs and the nursing notes.                              Differential diagnosis includes, but is not limited to, hemorrhoids, fissures  Patient's presentation is most consistent with acute complicated illness / injury requiring diagnostic workup.  Patient's diagnosis is consistent with rectal bleeding due to anal irritation.. Patient will be discharged home with prescriptions for Dibucaine. Patient is to follow up with primary provider as needed or otherwise directed. Patient is given ED precautions to return to the ED for any worsening or new symptoms.  FINAL CLINICAL IMPRESSION(S) / ED DIAGNOSES   Final diagnoses:  Rectal bleeding     Rx /  DC Orders   ED Discharge Orders          Ordered    dibucaine (NUPERCAINAL) 1 % OINT  3 times daily PRN        05/19/22 1437    famotidine (PEPCID) 20 MG tablet  2 times daily        05/19/22 1437             Note:  This document was prepared using Dragon voice recognition software and may include unintentional dictation errors.    Melvenia Needles, PA-C 05/19/22 Patrecia Pour    Duffy Bruce, MD 05/19/22 [phone]

## 2022-05-19 NOTE — Discharge Instructions (Addendum)
Your exam and labs are normal at this time.  Symptoms likely due to excessive bowel movements (diarrhea) over short period of time.  Take the antiacid medicine twice daily as directed.  Use the topical cool rectal pain gel as needed.  Increase your fluid intake and follow-up with primary provider as needed.

## 2022-05-19 NOTE — ED Triage Notes (Signed)
C/o rectal bleeding that started yesterday. Reports bright red in toilet. C/o abdominal cramping

## 2022-07-31 ENCOUNTER — Encounter: Payer: Self-pay | Admitting: Emergency Medicine

## 2022-07-31 ENCOUNTER — Emergency Department
Admission: EM | Admit: 2022-07-31 | Discharge: 2022-07-31 | Disposition: A | Discharge status: Home / Self Care | Payer: Medicaid Other | Attending: Emergency Medicine | Admitting: Emergency Medicine

## 2022-07-31 ENCOUNTER — Other Ambulatory Visit: Payer: Self-pay

## 2022-07-31 ENCOUNTER — Emergency Department: Payer: Medicaid Other

## 2022-07-31 DIAGNOSIS — M25562 Pain in left knee: Secondary | ICD-10-CM | POA: Insufficient documentation

## 2022-07-31 DIAGNOSIS — W130XXA Fall from, out of or through balcony, initial encounter: Secondary | ICD-10-CM | POA: Diagnosis not present

## 2022-07-31 MED ORDER — IBUPROFEN 600 MG PO TABS
600.0000 mg | ORAL_TABLET | Freq: Once | ORAL | Status: AC
  Administered 2022-07-31: 600 mg via ORAL
  Filled 2022-07-31: qty 1

## 2022-07-31 NOTE — Discharge Instructions (Signed)
You may wear the wrap for extra support, though please remove it at night.  You may continue to take Tylenol/ibuprofen per package instructions to help with your symptoms.  Please return for any new, worsening, or change in symptoms or other concerns.  Please follow-up with the orthopedist.  Please rest, ice, elevate your knee is much as possible as we discussed. It was a pleasure caring for you today.

## 2022-07-31 NOTE — ED Provider Notes (Signed)
[facility] Provider Note    Event Date/Time   First MD Initiated Contact with Patient 07/31/22 1217     (approximate)   History   Fall   HPI  Dennis Dodson is a 22 y.o. male with a past medical history of schizophrenia, TBI who presents today for evaluation of left sided knee pain for the past 2 days..  Patient reports that he had a misstep off of 1 stair 2 days ago and has had pain ever since.  He reports that he has been able to walk without difficulty.  He denies any other injuries.  He reports that he has pain with full extension of his knee but he is able to fully extend it.  He has not had any swelling.  Patient Active Problem List   Diagnosis Date Noted   Cannabis use disorder 03/04/2021   Nicotine abuse 12/03/2020   Schizophrenia, acute undifferentiated (HCC) 03/02/2020   Anxiety disorder 06/19/2019   BMI (body mass index), pediatric, 85% to less than 95% for age 24/18/2021   Closed head injury 08/29/2018   Cognitive and behavioral changes 08/29/2018   Post concussion syndrome 08/29/2018   Traumatic brain injury with loss of consciousness (HCC) 08/29/2018   Visual loss, both eyes unqualified 01/31/2014   Attention-deficit hyperactivity disorder 12/11/2013           Physical Exam   Triage Vital Signs: ED Triage Vitals  Enc Vitals Group     BP 07/31/22 1221 117/82     Pulse Rate 07/31/22 1221 85     Resp 07/31/22 1221 18     Temp 07/31/22 1221 97.6 F (36.4 C)     Temp Source 07/31/22 1221 Oral     SpO2 07/31/22 1221 98 %     Weight 07/31/22 1224 218 lb 4.1 oz (99 kg)     Height 07/31/22 1224 5\' 9"  (1.753 m)     Head Circumference --      Peak Flow --      Pain Score 07/31/22 1224 6     Pain Loc --      Pain Edu? --      Excl. in GC? --     Most recent vital signs: Vitals:   07/31/22 1221  BP: 117/82  Pulse: 85  Resp: 18  Temp: 97.6 F (36.4 C)  SpO2: 98%    Physical Exam Vitals and nursing note reviewed.   Constitutional:      General: Awake and alert. No acute distress.    Appearance: Normal appearance. The patient is normal weight.  HENT:     Head: Normocephalic and atraumatic.     Mouth: Mucous membranes are moist.  Eyes:     General: PERRL. Normal EOMs        Right eye: No discharge.        Left eye: No discharge.     Conjunctiva/sclera: Conjunctivae normal.  Cardiovascular:     Rate and Rhythm: Normal rate and regular rhythm.     Pulses: Normal pulses.  Pulmonary:     Effort: Pulmonary effort is normal. No respiratory distress.     Breath sounds: Normal breath sounds.  Abdominal:     Abdomen is soft. There is no abdominal tenderness. No rebound or guarding. No distention. Musculoskeletal:        General: No swelling. Normal range of motion.     Cervical back: Normal range of motion and neck supple.  Left knee: No deformity or  rash.  Mild inferior medial joint line tenderness. No patellar tenderness, no ballotment Warm and well perfused extremity with 2+ pedal pulses 5/5 strength to dorsiflexion and plantarflexion at the ankle with intact sensation throughout extremity Normal range of motion of the knee, with intact flexion and extension to active and passive range of motion. Extensor mechanism intact. No ligamentous laxity. Negative anterior/posterior drawer/negative lachman, mild discomfort with mcmurrays No effusion or warmth Intact quadriceps, hamstring function, patellar tendon function Pelvis stable Full ROM of ankle without pain or swelling Foot warm and well perfused Skin:    General: Skin is warm and dry.     Capillary Refill: Capillary refill takes less than 2 seconds.     Findings: No rash.  Neurological:     Mental Status: The patient is awake and alert.      ED Results / Procedures / Treatments   Labs (all labs ordered are listed, but only abnormal results are displayed) Labs Reviewed - No data to display   EKG     RADIOLOGY I independently  reviewed and interpreted imaging and agree with radiologists findings.     PROCEDURES:  Critical Care performed:   Procedures   MEDICATIONS ORDERED IN ED: Medications  ibuprofen (ADVIL) tablet 600 mg (600 mg Oral Given 07/31/22 1228)     IMPRESSION / MDM / ASSESSMENT AND PLAN / ED COURSE  I reviewed the triage vital signs and the nursing notes.   Differential diagnosis includes, but is not limited to, meniscus injury, effusion, sprain, contusion, dislocation, fracture, joint infection, tendon rupture.   Patient is awake and alert, hemodynamically stable and afebrile.  He is neurologically and neurovascularly intact.  He has normal distal pulses, normal capillary refill, sensation is intact and equal to opposite.  Normal strength and sensation as well to bilateral lower extremities.  No evidence of neurological deficit or vascular compromise on exam. No fracture/dislocation on X-Ray.  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  There is mild notching of the anterior contour of the medial and lateral femoral condyles, though patient does not have any ligamental laxity.  He was also found to have periosteal otitis along the proximal fibular metadiaphysis, though he has no tenderness in this location therefore do not suspect occult fracture.  There is no no significant swelling, effusion, patient has normal strength and sensation and normal distal pulses, I do not suspect dislocation.  No deformity or obvious ligamentous laxity on exam. No constitutional symptoms, warmth, erythema, or effusion to suggest septic joint, and patient has full active and passive range of motion of his knee. Overall well appearing, vital signs stable.  He was treated with ibuprofen with significant improvement of his symptoms.  He was given an Ace wrap for extra support and instructed to follow-up with orthopedics.  The appropriate  follow-up information was provided.  Return precautions and care instructions discussed. Patient agrees with plan of care.  He was discharged in stable condition and is ambulatory with a steady gait.    Patient's presentation is most consistent with acute complicated illness / injury requiring diagnostic workup.   FINAL CLINICAL IMPRESSION(S) / ED DIAGNOSES   Final diagnoses:  Acute pain of left knee     Rx / DC Orders   ED Discharge Orders     None        Note:  This document was prepared using Dragon voice recognition software and may include unintentional dictation  errors.   Jackelyn Hoehn, PA-C 07/31/22 1427    Georga Hacking, MD 07/31/22 [phone]

## 2022-07-31 NOTE — ED Triage Notes (Addendum)
Presents via EMS from home  States he fell from [employer] he hit his back   but is having pain with some swelling to left knee
